# Patient Record
Sex: Female | Born: 1965 | Race: Asian | Hispanic: No | Marital: Married | Smoking: Current some day smoker
Health system: Southern US, Community
[De-identification: ages and names within clinical notes are randomized; demographics above are authoritative.]

## PROBLEM LIST (undated history)

## (undated) DIAGNOSIS — K219 Gastro-esophageal reflux disease without esophagitis: Secondary | ICD-10-CM

## (undated) DIAGNOSIS — T4145XA Adverse effect of unspecified anesthetic, initial encounter: Secondary | ICD-10-CM

## (undated) DIAGNOSIS — T8859XA Other complications of anesthesia, initial encounter: Secondary | ICD-10-CM

## (undated) DIAGNOSIS — D649 Anemia, unspecified: Secondary | ICD-10-CM

## (undated) DIAGNOSIS — R7303 Prediabetes: Secondary | ICD-10-CM

## (undated) DIAGNOSIS — M1990 Unspecified osteoarthritis, unspecified site: Secondary | ICD-10-CM

## (undated) DIAGNOSIS — I10 Essential (primary) hypertension: Secondary | ICD-10-CM

## (undated) HISTORY — DX: Essential (primary) hypertension: I10

## (undated) HISTORY — DX: Anemia, unspecified: D64.9

## (undated) HISTORY — PX: TUBAL LIGATION: SHX77

## (undated) HISTORY — PX: SHOULDER SURGERY: SHX246

---

## 2007-08-17 HISTORY — PX: ENDOMETRIAL BIOPSY: SHX622

## 2009-03-06 ENCOUNTER — Ambulatory Visit: Payer: Self-pay

## 2009-03-13 ENCOUNTER — Ambulatory Visit: Payer: Self-pay

## 2011-04-07 ENCOUNTER — Encounter: Payer: Self-pay | Admitting: Obstetrics and Gynecology

## 2011-04-12 ENCOUNTER — Ambulatory Visit (INDEPENDENT_AMBULATORY_CARE_PROVIDER_SITE_OTHER): Payer: PRIVATE HEALTH INSURANCE | Admitting: Obstetrics and Gynecology

## 2011-04-12 ENCOUNTER — Encounter: Payer: Self-pay | Admitting: Obstetrics and Gynecology

## 2011-04-12 VITALS — BP 148/104 | HR 94 | Ht 63.0 in | Wt 148.0 lb

## 2011-04-12 DIAGNOSIS — Z1272 Encounter for screening for malignant neoplasm of vagina: Secondary | ICD-10-CM

## 2011-04-12 DIAGNOSIS — Z01419 Encounter for gynecological examination (general) (routine) without abnormal findings: Secondary | ICD-10-CM

## 2011-04-12 DIAGNOSIS — I10 Essential (primary) hypertension: Secondary | ICD-10-CM | POA: Insufficient documentation

## 2011-04-12 DIAGNOSIS — Z1231 Encounter for screening mammogram for malignant neoplasm of breast: Secondary | ICD-10-CM

## 2011-04-12 NOTE — Progress Notes (Signed)
  Subjective:    Patient ID: Bosie Helper, female    DOB: 1966/06/06, 45 y.o.   MRN: [id number]  HPI 45 yo G3P3 presenting today for annual exam. Patient c/o abnormal vaginal odor, no discharge a week ago. This episode self-resolved but patient desires to be screened for yeast infection. Patient also with HTN but not currently being treated with antihypertensives. Patient denies pelvic pain or any other complaints. Patient is not concerned about STD at this time.   PMHx: HTN  PSHx: c-section x2, BTL, shoulder surgery  POBHx: 3 full term pregnancies  PGynHx: Denies cyst, fibroids or h/o abnormal pap smears  Family Hx: HTN, DM  Social Hx: Denies drinking or the use of illicit drugs, current smoker of 5 cigs/day  Review of Systems  All other systems reviewed and are negative.       Objective:   Physical Exam  GENERAL: Well-developed, well-nourished female in no acute distress.  HEENT: Normocephalic, atraumatic. Sclerae anicteric.  NECK: Supple. Normal thyroid.  LUNGS: Clear to auscultation bilaterally.  HEART: Regular rate and rhythm. BREASTS: Symmetric in size. No palpable masses or lymphadenopathy, skin changes, or nipple drainage. ABDOMEN: Soft, nontender, nondistended. No organomegaly. PELVIC: Normal external female genitalia. Vagina is pink and rugated.  Normal discharge. Normal appearing cervix. Uterus is normal in size.  No adnexal mass or tenderness. EXTREMITIES: No cyanosis, clubbing, or edema, 2+ distal pulses.       Assessment & Plan:  45 yo G3P3 here for annual exam. - Normal GYN exam - pap smear collected - referral for mammography provided - patient will be contacted with any abnormal results - patient advised to follow-up with primary care physician for management of HTN. - smoking cessation also advised. Patient states that she is trying to quit.

## 2011-04-12 NOTE — Patient Instructions (Addendum)
Medical Screening Exam You have had a medical screening exam to help find the cause of your problem, and to see if it requires further treatment right away. Your exam has shown that you do not require emergency treatment. It is safe for you to go to your caregiver's office or clinic for treatment. Plans may have been made for you to be seen by your caregiver today. Patients can be treated in an emergency room regardless of their ability to pay. You can decide to stay and receive emergency treatment. Some insurance plans may refuse to pay for this service if it is not approved by your primary caregiver. The hospital or your caregiver may then bill you directly for your medical evaluation and treatment. If your condition worsens or changes in any way, you can return for re-evaluation. You may also go to your caregiver's office or clinic for treatment. Document Released: 09/09/2004 Document Re-Released: 05/11/2008 [facility] Patient Information 2011 [place], [place].  Hypertension (High Blood Pressure) As your heart beats, it forces blood through your arteries. This force is your blood pressure. If the pressure is too high, it is called hypertension (HTN) or high blood pressure. HTN is dangerous because you may have it and not know it. High blood pressure may mean that your heart has to work harder to pump blood. Your arteries may be narrow or stiff. The extra work puts you at risk for heart disease, stroke, and other problems.  Blood pressure consists of two numbers, a higher number over a lower, 110/72, for example. It is stated as "110 over 72." The ideal is below 120 for the top number (systolic) and under 80 for the bottom (diastolic). Write down your blood pressure today. You should pay close attention to your blood pressure if you have certain conditions such as:  Heart failure.  Prior heart attack.   Diabetes   Chronic kidney disease.   Prior stroke.   Multiple risk factors for heart disease.    To see if you have HTN, your blood pressure should be measured while you are seated with your arm held at the level of the heart. It should be measured at least twice. A one-time elevated blood pressure reading (especially in the Emergency Department) does not mean that you need treatment. There may be conditions in which the blood pressure is different between your right and left arms. It is important to see your caregiver soon for a recheck. Most people have essential hypertension which means that there is not a specific cause. This type of high blood pressure may be lowered by changing lifestyle factors such as:  Stress.  Smoking.   Lack of exercise.   Excessive weight.  Drug/tobacco/alcohol use.   Eating less salt.   Most people do not have symptoms from high blood pressure until it has caused damage to the body. Effective treatment can often prevent, delay or reduce that damage. TREATMENT Treatment for high blood pressure, when a cause has been identified, is directed at the cause. There are a large number of medications to treat HTN. These fall into several categories, and your caregiver will help you select the medicines that are best for you. Medications may have side effects. You should review side effects with your caregiver. If your blood pressure stays high after you have made lifestyle changes or started on medicines,   Your medication(s) may need to be changed.   Other problems may need to be addressed.   Be certain you understand your prescriptions,  and know how and when to take your medicine.   Be sure to follow up with your caregiver within the time frame advised (usually within two weeks) to have your blood pressure rechecked and to review your medications.   If you are taking more than one medicine to lower your blood pressure, make sure you know how and at what times they should be taken. Taking two medicines at the same time can result in blood pressure that is too  low.  SEEK IMMEDIATE MEDICAL CARE IF YOU DEVELOP:  A severe headache, blurred or changing vision, or confusion.   Unusual weakness or numbness, or a faint feeling.   Severe chest or abdominal pain, vomiting, or breathing problems.  MAKE SURE YOU:   Understand these instructions.   Will watch your condition.   Will get help right away if you are not doing well or get worse.  Document Released: 08/02/2005 Document Re-Released: 01/20/2010 [facility] Patient Information 2011 [place], [place].

## 2011-04-13 ENCOUNTER — Other Ambulatory Visit: Payer: Self-pay | Admitting: Obstetrics and Gynecology

## 2011-04-14 LAB — WET PREP, GENITAL: Trich, Wet Prep: NONE SEEN

## 2011-05-03 ENCOUNTER — Ambulatory Visit (HOSPITAL_COMMUNITY)
Admission: RE | Admit: 2011-05-03 | Discharge: 2011-05-03 | Disposition: A | Discharge status: Home / Self Care | Payer: PRIVATE HEALTH INSURANCE | Source: Ambulatory Visit | Attending: Obstetrics and Gynecology | Admitting: Obstetrics and Gynecology

## 2011-05-03 DIAGNOSIS — Z1231 Encounter for screening mammogram for malignant neoplasm of breast: Secondary | ICD-10-CM | POA: Insufficient documentation

## 2011-10-27 ENCOUNTER — Emergency Department: Payer: Self-pay | Admitting: Internal Medicine

## 2011-10-27 LAB — COMPREHENSIVE METABOLIC PANEL
BUN: 15 mg/dL (ref 7–18)
EGFR (African American): >60
Glucose: 85 mg/dL (ref 65–99)
Potassium: 3.9 mmol/L (ref 3.5–5.1)
SGOT(AST): 20 U/L (ref 15–37)
SGPT (ALT): 21 U/L
Total Protein: 8 g/dL (ref 6.4–8.2)

## 2011-10-27 LAB — CK TOTAL AND CKMB (NOT AT ARMC): CK-MB: <0.5 ng/mL — ABNORMAL LOW (ref 0.5–3.6)

## 2011-10-27 LAB — CBC
HGB: 11.3 g/dL — ABNORMAL LOW (ref 12.0–16.0)
MCHC: 31.7 g/dL — ABNORMAL LOW (ref 32.0–36.0)
Platelet: 233 10*3/uL (ref 150–440)
RBC: 4.63 10*6/uL (ref 3.80–5.20)
RDW: 16.8 % — ABNORMAL HIGH (ref 11.5–14.5)

## 2011-11-23 ENCOUNTER — Ambulatory Visit: Payer: Self-pay | Admitting: Internal Medicine

## 2012-04-11 ENCOUNTER — Ambulatory Visit (INDEPENDENT_AMBULATORY_CARE_PROVIDER_SITE_OTHER): Payer: PRIVATE HEALTH INSURANCE | Admitting: Family Medicine

## 2012-04-11 ENCOUNTER — Encounter: Payer: Self-pay | Admitting: Family Medicine

## 2012-04-11 VITALS — BP 150/100 | HR 80 | Ht 63.0 in | Wt 145.0 lb

## 2012-04-11 DIAGNOSIS — Z124 Encounter for screening for malignant neoplasm of cervix: Secondary | ICD-10-CM

## 2012-04-11 DIAGNOSIS — K219 Gastro-esophageal reflux disease without esophagitis: Secondary | ICD-10-CM | POA: Insufficient documentation

## 2012-04-11 DIAGNOSIS — Z1231 Encounter for screening mammogram for malignant neoplasm of breast: Secondary | ICD-10-CM

## 2012-04-11 DIAGNOSIS — Z01419 Encounter for gynecological examination (general) (routine) without abnormal findings: Secondary | ICD-10-CM

## 2012-04-11 DIAGNOSIS — Z1151 Encounter for screening for human papillomavirus (HPV): Secondary | ICD-10-CM

## 2012-04-11 NOTE — Patient Instructions (Addendum)
Preventive Care for Adults, Female A healthy lifestyle and preventive care can promote health and wellness. Preventive health guidelines for women include the following key practices.  A routine yearly physical is a good way to check with your caregiver about your health and preventive screening. It is a chance to share any concerns and updates on your health, and to receive a thorough exam.   Visit your dentist for a routine exam and preventive care every 6 months. Brush your teeth twice a day and floss once a day. Good oral hygiene prevents tooth decay and gum disease.   The frequency of eye exams is based on your age, health, family medical history, use of contact lenses, and other factors. Follow your caregiver's recommendations for frequency of eye exams.   Eat a healthy diet. Foods like vegetables, fruits, whole grains, low-fat dairy products, and lean protein foods contain the nutrients you need without too many calories. Decrease your intake of foods high in solid fats, added sugars, and salt. Eat the right amount of calories for you.Get information about a proper diet from your caregiver, if necessary.   Regular physical exercise is one of the most important things you can do for your health. Most adults should get at least 150 minutes of moderate-intensity exercise (any activity that increases your heart rate and causes you to sweat) each week. In addition, most adults need muscle-strengthening exercises on 2 or more days a week.   Maintain a healthy weight. The body mass index (BMI) is a screening tool to identify possible weight problems. It provides an estimate of body fat based on height and weight. Your caregiver can help determine your BMI, and can help you achieve or maintain a healthy weight.For adults 20 years and older:   A BMI below 18.5 is considered underweight.   A BMI of 18.5 to 24.9 is normal.   A BMI of 25 to 29.9 is considered overweight.   A BMI of 30 and above is  considered obese.   Maintain normal blood lipids and cholesterol levels by exercising and minimizing your intake of saturated fat. Eat a balanced diet with plenty of fruit and vegetables. Blood tests for lipids and cholesterol should begin at age 20 and be repeated every 5 years. If your lipid or cholesterol levels are high, you are over 50, or you are at high risk for heart disease, you may need your cholesterol levels checked more frequently.Ongoing high lipid and cholesterol levels should be treated with medicines if diet and exercise are not effective.   If you smoke, find out from your caregiver how to quit. If you do not use tobacco, do not start.   If you are pregnant, do not drink alcohol. If you are breastfeeding, be very cautious about drinking alcohol. If you are not pregnant and choose to drink alcohol, do not exceed 1 drink per day. One drink is considered to be 12 ounces (355 mL) of beer, 5 ounces (148 mL) of wine, or 1.5 ounces (44 mL) of liquor.   Avoid use of street drugs. Do not share needles with anyone. Ask for help if you need support or instructions about stopping the use of drugs.   High blood pressure causes heart disease and increases the risk of stroke. Your blood pressure should be checked at least every 1 to 2 years. Ongoing high blood pressure should be treated with medicines if weight loss and exercise are not effective.   If you are 55 to 45   years old, ask your caregiver if you should take aspirin to prevent strokes.   Diabetes screening involves taking a blood sample to check your fasting blood sugar level. This should be done once every 3 years, after age 45, if you are within normal weight and without risk factors for diabetes. Testing should be considered at a younger age or be carried out more frequently if you are overweight and have at least 1 risk factor for diabetes.   Breast cancer screening is essential preventive care for women. You should practice "breast  self-awareness." This means understanding the normal appearance and feel of your breasts and may include breast self-examination. Any changes detected, no matter how small, should be reported to a caregiver. Women in their 20s and 30s should have a clinical breast exam (CBE) by a caregiver as part of a regular health exam every 1 to 3 years. After age 40, women should have a CBE every year. Starting at age 40, women should consider having a mammography (breast X-ray test) every year. Women who have a family history of breast cancer should talk to their caregiver about genetic screening. Women at a high risk of breast cancer should talk to their caregivers about having magnetic resonance imaging (MRI) and a mammography every year.   The Pap test is a screening test for cervical cancer. A Pap test can show cell changes on the cervix that might become cervical cancer if left untreated. A Pap test is a procedure in which cells are obtained and examined from the lower end of the uterus (cervix).   Women should have a Pap test starting at age 21.   Between ages 21 and 29, Pap tests should be repeated every 2 years.   Beginning at age 30, you should have a Pap test every 3 years as long as the past 3 Pap tests have been normal.   Some women have medical problems that increase the chance of getting cervical cancer. Talk to your caregiver about these problems. It is especially important to talk to your caregiver if a new problem develops soon after your last Pap test. In these cases, your caregiver may recommend more frequent screening and Pap tests.   The above recommendations are the same for women who have or have not gotten the vaccine for human papillomavirus (HPV).   If you had a hysterectomy for a problem that was not cancer or a condition that could lead to cancer, then you no longer need Pap tests. Even if you no longer need a Pap test, a regular exam is a good idea to make sure no other problems are  starting.   If you are between ages 65 and 70, and you have had normal Pap tests going back 10 years, you no longer need Pap tests. Even if you no longer need a Pap test, a regular exam is a good idea to make sure no other problems are starting.   If you have had past treatment for cervical cancer or a condition that could lead to cancer, you need Pap tests and screening for cancer for at least 20 years after your treatment.   If Pap tests have been discontinued, risk factors (such as a new sexual partner) need to be reassessed to determine if screening should be resumed.   The HPV test is an additional test that may be used for cervical cancer screening. The HPV test looks for the virus that can cause the cell changes on the cervix.   The cells collected during the Pap test can be tested for HPV. The HPV test could be used to screen women aged 30 years and older, and should be used in women of any age who have unclear Pap test results. After the age of 30, women should have HPV testing at the same frequency as a Pap test.   Colorectal cancer can be detected and often prevented. Most routine colorectal cancer screening begins at the age of 50 and continues through age 75. However, your caregiver may recommend screening at an earlier age if you have risk factors for colon cancer. On a yearly basis, your caregiver may provide home test kits to check for hidden blood in the stool. Use of a small camera at the end of a tube, to directly examine the colon (sigmoidoscopy or colonoscopy), can detect the earliest forms of colorectal cancer. Talk to your caregiver about this at age 50, when routine screening begins. Direct examination of the colon should be repeated every 5 to 10 years through age 75, unless early forms of pre-cancerous polyps or small growths are found.   Hepatitis C blood testing is recommended for all people born from 1945 through 1965 and any individual with known risks for hepatitis C.    Practice safe sex. Use condoms and avoid high-risk sexual practices to reduce the spread of sexually transmitted infections (STIs). STIs include gonorrhea, chlamydia, syphilis, trichomonas, herpes, HPV, and human immunodeficiency virus (HIV). Herpes, HIV, and HPV are viral illnesses that have no cure. They can result in disability, cancer, and death. Sexually active women aged 25 and younger should be checked for chlamydia. Older women with new or multiple partners should also be tested for chlamydia. Testing for other STIs is recommended if you are sexually active and at increased risk.   Osteoporosis is a disease in which the bones lose minerals and strength with aging. This can result in serious bone fractures. The risk of osteoporosis can be identified using a bone density scan. Women ages 65 and over and women at risk for fractures or osteoporosis should discuss screening with their caregivers. Ask your caregiver whether you should take a calcium supplement or vitamin D to reduce the rate of osteoporosis.   Menopause can be associated with physical symptoms and risks. Hormone replacement therapy is available to decrease symptoms and risks. You should talk to your caregiver about whether hormone replacement therapy is right for you.   Use sunscreen with sun protection factor (SPF) of 30 or more. Apply sunscreen liberally and repeatedly throughout the day. You should seek shade when your shadow is shorter than you. Protect yourself by wearing long sleeves, pants, a wide-brimmed hat, and sunglasses year round, whenever you are outdoors.   Once a month, do a whole body skin exam, using a mirror to look at the skin on your back. Notify your caregiver of new moles, moles that have irregular borders, moles that are larger than a pencil eraser, or moles that have changed in shape or color.   Stay current with required immunizations.   Influenza. You need a dose every fall (or winter). The composition of  the flu vaccine changes each year, so being vaccinated once is not enough.   Pneumococcal polysaccharide. You need 1 to 2 doses if you smoke cigarettes or if you have certain chronic medical conditions. You need 1 dose at age 65 (or older) if you have never been vaccinated.   Tetanus, diphtheria, pertussis (Tdap, Td). Get 1 dose of   Tdap vaccine if you are younger than age 65, are over 65 and have contact with an infant, are a healthcare worker, are pregnant, or simply want to be protected from whooping cough. After that, you need a Td booster dose every 10 years. Consult your caregiver if you have not had at least 3 tetanus and diphtheria-containing shots sometime in your life or have a deep or dirty wound.   HPV. You need this vaccine if you are a woman age 26 or younger. The vaccine is given in 3 doses over 6 months.   Measles, mumps, rubella (MMR). You need at least 1 dose of MMR if you were born in 1957 or later. You may also need a second dose.   Meningococcal. If you are age 19 to 21 and a first-year college student living in a residence hall, or have one of several medical conditions, you need to get vaccinated against meningococcal disease. You may also need additional booster doses.   Zoster (shingles). If you are age 60 or older, you should get this vaccine.   Varicella (chickenpox). If you have never had chickenpox or you were vaccinated but received only 1 dose, talk to your caregiver to find out if you need this vaccine.   Hepatitis A. You need this vaccine if you have a specific risk factor for hepatitis A virus infection or you simply wish to be protected from this disease. The vaccine is usually given as 2 doses, 6 to 18 months apart.   Hepatitis B. You need this vaccine if you have a specific risk factor for hepatitis B virus infection or you simply wish to be protected from this disease. The vaccine is given in 3 doses, usually over 6 months.  Preventive Services /  Frequency Ages 19 to 39  Blood pressure check.** / Every 1 to 2 years.   Lipid and cholesterol check.** / Every 5 years beginning at age 20.   Clinical breast exam.** / Every 3 years for women in their 20s and 30s.   Pap test.** / Every 2 years from ages 21 through 29. Every 3 years starting at age 30 through age 65 or 70 with a history of 3 consecutive normal Pap tests.   HPV screening.** / Every 3 years from ages 30 through ages 65 to 70 with a history of 3 consecutive normal Pap tests.   Hepatitis C blood test.** / For any individual with known risks for hepatitis C.   Skin self-exam. / Monthly.   Influenza immunization.** / Every year.   Pneumococcal polysaccharide immunization.** / 1 to 2 doses if you smoke cigarettes or if you have certain chronic medical conditions.   Tetanus, diphtheria, pertussis (Tdap, Td) immunization. / A one-time dose of Tdap vaccine. After that, you need a Td booster dose every 10 years.   HPV immunization. / 3 doses over 6 months, if you are 26 and younger.   Measles, mumps, rubella (MMR) immunization. / You need at least 1 dose of MMR if you were born in 1957 or later. You may also need a second dose.   Meningococcal immunization. / 1 dose if you are age 19 to 21 and a first-year college student living in a residence hall, or have one of several medical conditions, you need to get vaccinated against meningococcal disease. You may also need additional booster doses.   Varicella immunization.** / Consult your caregiver.   Hepatitis A immunization.** / Consult your caregiver. 2 doses, 6 to 18 months   apart.   Hepatitis B immunization.** / Consult your caregiver. 3 doses usually over 6 months.  Ages 40 to 64  Blood pressure check.** / Every 1 to 2 years.   Lipid and cholesterol check.** / Every 5 years beginning at age 20.   Clinical breast exam.** / Every year after age 40.   Mammogram.** / Every year beginning at age 40 and continuing for as  long as you are in good health. Consult with your caregiver.   Pap test.** / Every 3 years starting at age 30 through age 65 or 70 with a history of 3 consecutive normal Pap tests.   HPV screening.** / Every 3 years from ages 30 through ages 65 to 70 with a history of 3 consecutive normal Pap tests.   Fecal occult blood test (FOBT) of stool. / Every year beginning at age 50 and continuing until age 75. You may not need to do this test if you get a colonoscopy every 10 years.   Flexible sigmoidoscopy or colonoscopy.** / Every 5 years for a flexible sigmoidoscopy or every 10 years for a colonoscopy beginning at age 50 and continuing until age 75.   Hepatitis C blood test.** / For all people born from 1945 through 1965 and any individual with known risks for hepatitis C.   Skin self-exam. / Monthly.   Influenza immunization.** / Every year.   Pneumococcal polysaccharide immunization.** / 1 to 2 doses if you smoke cigarettes or if you have certain chronic medical conditions.   Tetanus, diphtheria, pertussis (Tdap, Td) immunization.** / A one-time dose of Tdap vaccine. After that, you need a Td booster dose every 10 years.   Measles, mumps, rubella (MMR) immunization. / You need at least 1 dose of MMR if you were born in 1957 or later. You may also need a second dose.   Varicella immunization.** / Consult your caregiver.   Meningococcal immunization.** / Consult your caregiver.   Hepatitis A immunization.** / Consult your caregiver. 2 doses, 6 to 18 months apart.   Hepatitis B immunization.** / Consult your caregiver. 3 doses, usually over 6 months.  Ages 65 and over  Blood pressure check.** / Every 1 to 2 years.   Lipid and cholesterol check.** / Every 5 years beginning at age 20.   Clinical breast exam.** / Every year after age 40.   Mammogram.** / Every year beginning at age 40 and continuing for as long as you are in good health. Consult with your caregiver.   Pap test.** /  Every 3 years starting at age 30 through age 65 or 70 with a 3 consecutive normal Pap tests. Testing can be stopped between 65 and 70 with 3 consecutive normal Pap tests and no abnormal Pap or HPV tests in the past 10 years.   HPV screening.** / Every 3 years from ages 30 through ages 65 or 70 with a history of 3 consecutive normal Pap tests. Testing can be stopped between 65 and 70 with 3 consecutive normal Pap tests and no abnormal Pap or HPV tests in the past 10 years.   Fecal occult blood test (FOBT) of stool. / Every year beginning at age 50 and continuing until age 75. You may not need to do this test if you get a colonoscopy every 10 years.   Flexible sigmoidoscopy or colonoscopy.** / Every 5 years for a flexible sigmoidoscopy or every 10 years for a colonoscopy beginning at age 50 and continuing until age 75.   Hepatitis   C blood test.** / For all people born from 1945 through 1965 and any individual with known risks for hepatitis C.   Osteoporosis screening.** / A one-time screening for women ages 65 and over and women at risk for fractures or osteoporosis.   Skin self-exam. / Monthly.   Influenza immunization.** / Every year.   Pneumococcal polysaccharide immunization.** / 1 dose at age 65 (or older) if you have never been vaccinated.   Tetanus, diphtheria, pertussis (Tdap, Td) immunization. / A one-time dose of Tdap vaccine if you are over 65 and have contact with an infant, are a healthcare worker, or simply want to be protected from whooping cough. After that, you need a Td booster dose every 10 years.   Varicella immunization.** / Consult your caregiver.   Meningococcal immunization.** / Consult your caregiver.   Hepatitis A immunization.** / Consult your caregiver. 2 doses, 6 to 18 months apart.   Hepatitis B immunization.** / Check with your caregiver. 3 doses, usually over 6 months.  ** Family history and personal history of risk and conditions may change your caregiver's  recommendations. Document Released: 09/28/2001 Document Revised: 07/22/2011 Document Reviewed: 12/28/2010 [employer] Patient Information 2012 [employer].Smoking Cessation This document explains the best ways for you to quit smoking and new treatments to help. It lists new medicines that can double or triple your chances of quitting and quitting for good. It also considers ways to avoid relapses and concerns you may have about quitting, including weight gain. NICOTINE: A POWERFUL ADDICTION If you have tried to quit smoking, you know how hard it can be. It is hard because nicotine is a very addictive drug. For some people, it can be as addictive as heroin or cocaine. Usually, people make 2 or 3 tries, or more, before finally being able to quit. Each time you try to quit, you can learn about what helps and what hurts. Quitting takes hard work and a lot of effort, but you can quit smoking. QUITTING SMOKING IS ONE OF THE MOST IMPORTANT THINGS YOU WILL EVER DO.  You will live longer, feel better, and live better.   The impact on your body of quitting smoking is felt almost immediately:   Within 20 minutes, blood pressure decreases. Pulse returns to its normal level.   After 8 hours, carbon monoxide levels in the blood return to normal. Oxygen level increases.   After 24 hours, chance of heart attack starts to decrease. Breath, hair, and body stop smelling like smoke.   After 48 hours, damaged nerve endings begin to recover. Sense of taste and smell improve.   After 72 hours, the body is virtually free of nicotine. Bronchial tubes relax and breathing becomes easier.   After 2 to 12 weeks, lungs can hold more air. Exercise becomes easier and circulation improves.   Quitting will reduce your risk of having a heart attack, stroke, cancer, or lung disease:   After 1 year, the risk of coronary heart disease is cut in half.   After 5 years, the risk of stroke falls to the same as a nonsmoker.    After 10 years, the risk of lung cancer is cut in half and the risk of other cancers decreases significantly.   After 15 years, the risk of coronary heart disease drops, usually to the level of a nonsmoker.   If you are pregnant, quitting smoking will improve your chances of having a healthy baby.   The people you live with, especially your children, will   be healthier.   You will have extra money to spend on things other than cigarettes.  FIVE KEYS TO QUITTING Studies have shown that these 5 steps will help you quit smoking and quit for good. You have the best chances of quitting if you use them together: 1. Get ready.  2. Get support and encouragement.  3. Learn new skills and behaviors.  4. Get medicine to reduce your nicotine addiction and use it correctly.  5. Be prepared for relapse or difficult situations. Be determined to continue trying to quit, even if you do not succeed at first.  1. GET READY  Set a quit date.   Change your environment.   Get rid of ALL cigarettes, ashtrays, matches, and lighters in your home, car, and place of work.   Do not let people smoke in your home.   Review your past attempts to quit. Think about what worked and what did not.   Once you quit, do not smoke. NOT EVEN A PUFF!  2. GET SUPPORT AND ENCOURAGEMENT Studies have shown that you have a better chance of being successful if you have help. You can get support in many ways.  Tell your family, friends, and coworkers that you are going to quit and need their support. Ask them not to smoke around you.   Talk to your caregivers (doctor, dentist, nurse, pharmacist, psychologist, and/or smoking counselor).   Get individual, group, or telephone counseling and support. The more counseling you have, the better your chances are of quitting. Programs are available at local hospitals and health centers. Call your local health department for information about programs in your area.   Spiritual beliefs  and practices may help some smokers quit.   Quit meters are small computer programs online or downloadable that keep track of quit statistics, such as amount of "quit-time," cigarettes not smoked, and money saved.   Many smokers find one or more of the many self-help books available useful in helping them quit and stay off tobacco.  3. LEARN NEW SKILLS AND BEHAVIORS  Try to distract yourself from urges to smoke. Talk to someone, go for a walk, or occupy your time with a task.   When you first try to quit, change your routine. Take a different route to work. Drink tea instead of coffee. Eat breakfast in a different place.   Do something to reduce your stress. Take a hot bath, exercise, or read a book.   Plan something enjoyable to do every day. Reward yourself for not smoking.   Explore interactive web-based programs that specialize in helping you quit.  4. GET MEDICINE AND USE IT CORRECTLY Medicines can help you stop smoking and decrease the urge to smoke. Combining medicine with the above behavioral methods and support can quadruple your chances of successfully quitting smoking. The [place] Food and Drug Administration (FDA) has approved 7 medicines to help you quit smoking. These medicines fall into 3 categories.  Nicotine replacement therapy (delivers nicotine to your body without the negative effects and risks of smoking):   Nicotine gum: Available over-the-counter.   Nicotine lozenges: Available over-the-counter.   Nicotine inhaler: Available by prescription.   Nicotine nasal spray: Available by prescription.   Nicotine skin patches (transdermal): Available by prescription and over-the-counter.   Antidepressant medicine (helps people abstain from smoking, but how this works is unknown):   Bupropion sustained-release (SR) tablets: Available by prescription.   Nicotinic receptor partial agonist (simulates the effect of nicotine in your brain):     Varenicline tartrate tablets:  Available by prescription.   Ask your caregiver for advice about which medicines to use and how to use them. Carefully read the information on the package.   Everyone who is trying to quit may benefit from using a medicine. If you are pregnant or trying to become pregnant, nursing an infant, you are under age 18, or you smoke fewer than 10 cigarettes per day, talk to your caregiver before taking any nicotine replacement medicines.   You should stop using a nicotine replacement product and call your caregiver if you experience nausea, dizziness, weakness, vomiting, fast or irregular heartbeat, mouth problems with the lozenge or gum, or redness or swelling of the skin around the patch that does not go away.   Do not use any other product containing nicotine while using a nicotine replacement product.   Talk to your caregiver before using these products if you have diabetes, heart disease, asthma, stomach ulcers, you had a recent heart attack, you have high blood pressure that is not controlled with medicine, a history of irregular heartbeat, or you have been prescribed medicine to help you quit smoking.  5. BE PREPARED FOR RELAPSE OR DIFFICULT SITUATIONS  Most relapses occur within the first 3 months after quitting. Do not be discouraged if you start smoking again. Remember, most people try several times before they finally quit.   You may have symptoms of withdrawal because your body is used to nicotine. You may crave cigarettes, be irritable, feel very hungry, cough often, get headaches, or have difficulty concentrating.   The withdrawal symptoms are only temporary. They are strongest when you first quit, but they will go away within 10 to 14 days.  Here are some difficult situations to watch for:  Alcohol. Avoid drinking alcohol. Drinking lowers your chances of successfully quitting.   Caffeine. Try to reduce the amount of caffeine you consume. It also lowers your chances of successfully  quitting.   Other smokers. Being around smoking can make you want to smoke. Avoid smokers.   Weight gain. Many smokers will gain weight when they quit, usually less than 10 pounds. Eat a healthy diet and stay active. Do not let weight gain distract you from your main goal, quitting smoking. Some medicines that help you quit smoking may also help delay weight gain. You can always lose the weight gained after you quit.   Bad mood or depression. There are a lot of ways to improve your mood other than smoking.  If you are having problems with any of these situations, talk to your caregiver. SPECIAL SITUATIONS AND CONDITIONS Studies suggest that everyone can quit smoking. Your situation or condition can give you a special reason to quit.  Pregnant women/new mothers: By quitting, you protect your baby's health and your own.   Hospitalized patients: By quitting, you reduce health problems and help healing.   Heart attack patients: By quitting, you reduce your risk of a second heart attack.   Lung, head, and neck cancer patients: By quitting, you reduce your chance of a second cancer.   Parents of children and adolescents: By quitting, you protect your children from illnesses caused by secondhand smoke.  QUESTIONS TO THINK ABOUT Think about the following questions before you try to stop smoking. You may want to talk about your answers with your caregiver.  Why do you want to quit?   If you tried to quit in the past, what helped and what did not?   What will   be the most difficult situations for you after you quit? How will you plan to handle them?   Who can help you through the tough times? Your family? Friends? Caregiver?   What pleasures do you get from smoking? What ways can you still get pleasure if you quit?  Here are some questions to ask your caregiver:  How can you help me to be successful at quitting?   What medicine do you think would be best for me and how should I take it?    What should I do if I need more help?   What is smoking withdrawal like? How can I get information on withdrawal?  Quitting takes hard work and a lot of effort, but you can quit smoking. FOR MORE INFORMATION  Smokefree.gov (http://www.smokefree.gov) provides free, accurate, evidence-based information and professional assistance to help support the immediate and long-term needs of people trying to quit smoking. Document Released: 07/27/2001 Document Revised: 07/22/2011 Document Reviewed: 05/19/2009 [employer] Patient Information 2012 [employer]. 

## 2012-04-11 NOTE — Progress Notes (Signed)
  Subjective:     Megan Pollard is a 46 y.o. female and is here for a comprehensive physical exam. The patient reports she takes her BP medication (unsure of name) only when BP is high.  Dr. Thedore Mins follows her and checks her labs.  Has regular cycles.  Does smoke < 0.25 ppd.  Desires to quit.  S/p BTL  History   Social History  . Marital Status: Married    Spouse Name: N/A    Number of Children: N/A  . Years of Education: N/A   Occupational History  . Not on file.   Social History Main Topics  . Smoking status: Current Everyday Smoker -- 0.2 packs/day    Types: Cigarettes  . Smokeless tobacco: Current User  . Alcohol Use: Yes     once or twice yearly  . Drug Use: No  . Sexually Active: Yes -- Female partner(s)     tubalization.   Other Topics Concern  . Not on file   Social History Narrative  . No narrative on file   Health Maintenance  Topic Date Due  . Tetanus/tdap  01/27/1986  . Influenza Vaccine  05/16/2012  . Pap Smear  04/11/2014    The following portions of the patient's history were reviewed and updated as appropriate: allergies, current medications, past family history, past medical history, past social history, past surgical history and problem list.  Review of Systems A comprehensive review of systems was negative.   Objective:    BP 150/100  Pulse 80  Ht 5\' 3"  (1.6 m)  Wt 145 lb (65.772 kg)  BMI 25.69 kg/m2  LMP 03/20/2012 General appearance: alert, cooperative and appears stated age Head: Normocephalic, without obvious abnormality, atraumatic Neck: no adenopathy, supple, symmetrical, trachea midline and thyroid not enlarged, symmetric, no tenderness/mass/nodules Lungs: clear to auscultation bilaterally Breasts: normal appearance, no masses or tenderness Heart: regular rate and rhythm, S1, S2 normal, no murmur, click, rub or gallop Abdomen: soft, non-tender; bowel sounds normal; no masses,  no organomegaly Pelvic: cervix normal in appearance, external  genitalia normal, no adnexal masses or tenderness, no cervical motion tenderness, uterus normal size, shape, and consistency and vagina normal without discharge Extremities: extremities normal, atraumatic, no cyanosis or edema Pulses: 2+ and symmetric Skin: Skin color, texture, turgor normal. No rashes or lesions or varicose veins on legs. Lymph nodes: Cervical, supraclavicular, and axillary nodes normal. Neurologic: Grossly normal    Assessment:    Healthy female exam. Elevated BP. Tobacco use.     Plan:    Pap smear Mammogram Reviewed smoking cessation Reviewed risks of elevated BP. See After Visit Summary for Counseling Recommendations

## 2012-04-25 ENCOUNTER — Ambulatory Visit (HOSPITAL_COMMUNITY)
Admission: RE | Admit: 2012-04-25 | Discharge: 2012-04-25 | Disposition: A | Discharge status: Home / Self Care | Payer: PRIVATE HEALTH INSURANCE | Source: Ambulatory Visit | Attending: Family Medicine | Admitting: Family Medicine

## 2012-04-25 DIAGNOSIS — Z1231 Encounter for screening mammogram for malignant neoplasm of breast: Secondary | ICD-10-CM | POA: Insufficient documentation

## 2012-09-21 ENCOUNTER — Telehealth: Payer: Self-pay | Admitting: *Deleted

## 2012-09-21 DIAGNOSIS — N390 Urinary tract infection, site not specified: Secondary | ICD-10-CM

## 2012-09-21 MED ORDER — PHENAZOPYRIDINE HCL 200 MG PO TABS: 200.0000 mg | ORAL_TABLET | Freq: Three times a day (TID) | ORAL | Status: DC | PRN

## 2012-09-21 MED ORDER — CIPROFLOXACIN HCL 500 MG PO TABS: 500.0000 mg | ORAL_TABLET | Freq: Two times a day (BID) | ORAL | Status: DC

## 2012-09-21 NOTE — Telephone Encounter (Signed)
Patient is having burning and pain with urination, this has been going on for 4-5 days now.  She is at work now and unable to leave and she would like to know if we can call in medication for her to help her out so she don't miss work.  She will call if her symptoms persist for an appointment.

## 2012-10-27 ENCOUNTER — Other Ambulatory Visit (INDEPENDENT_AMBULATORY_CARE_PROVIDER_SITE_OTHER): Payer: PRIVATE HEALTH INSURANCE

## 2012-10-27 DIAGNOSIS — R350 Frequency of micturition: Secondary | ICD-10-CM

## 2012-10-27 DIAGNOSIS — R319 Hematuria, unspecified: Secondary | ICD-10-CM

## 2012-10-27 DIAGNOSIS — R300 Dysuria: Secondary | ICD-10-CM

## 2012-10-27 LAB — URINALYSIS
Glucose, UA: NEGATIVE
Spec Grav, UA: >=1.03
Urobilinogen, UA: 0.2

## 2012-10-27 MED ORDER — SULFAMETHOXAZOLE-TRIMETHOPRIM 800-160 MG PO TABS: 1.0000 | ORAL_TABLET | Freq: Two times a day (BID) | ORAL | Status: DC

## 2012-10-27 NOTE — Progress Notes (Signed)
Loreena called and spoke with Darl Pikes, reports she has a UTI again and wants to be treated- reports dysuria, back pain same as last time- instructed patient to come in and give urine and we can start Septra- then when culture comes back - if another antibiotic would be better, we will call her. Patient came in and gave urine.

## 2012-10-30 LAB — URINE CULTURE: Colony Count: >=100000

## 2012-11-01 ENCOUNTER — Telehealth: Payer: Self-pay | Admitting: *Deleted

## 2012-11-01 DIAGNOSIS — N390 Urinary tract infection, site not specified: Secondary | ICD-10-CM

## 2012-11-01 MED ORDER — SULFAMETHOXAZOLE-TRIMETHOPRIM 800-160 MG PO TABS: 1.0000 | ORAL_TABLET | Freq: Two times a day (BID) | ORAL | Status: DC

## 2012-11-01 NOTE — Telephone Encounter (Signed)
Message left for patient to pick up rx from the pharmacy.

## 2013-05-29 ENCOUNTER — Encounter: Payer: Self-pay | Admitting: Obstetrics & Gynecology

## 2013-05-29 ENCOUNTER — Ambulatory Visit (INDEPENDENT_AMBULATORY_CARE_PROVIDER_SITE_OTHER): Payer: PRIVATE HEALTH INSURANCE | Admitting: Obstetrics & Gynecology

## 2013-05-29 VITALS — BP 154/97 | HR 86 | Ht 63.0 in | Wt 155.0 lb

## 2013-05-29 DIAGNOSIS — Z01419 Encounter for gynecological examination (general) (routine) without abnormal findings: Secondary | ICD-10-CM

## 2013-05-29 DIAGNOSIS — Z1151 Encounter for screening for human papillomavirus (HPV): Secondary | ICD-10-CM

## 2013-05-29 DIAGNOSIS — N390 Urinary tract infection, site not specified: Secondary | ICD-10-CM

## 2013-05-29 DIAGNOSIS — R8290 Unspecified abnormal findings in urine: Secondary | ICD-10-CM

## 2013-05-29 DIAGNOSIS — R82998 Other abnormal findings in urine: Secondary | ICD-10-CM

## 2013-05-29 DIAGNOSIS — Z124 Encounter for screening for malignant neoplasm of cervix: Secondary | ICD-10-CM

## 2013-05-29 LAB — POCT URINALYSIS DIPSTICK
Bilirubin, UA: NEGATIVE
Blood, UA: NEGATIVE
Nitrite, UA: NEGATIVE
pH, UA: 7

## 2013-05-29 NOTE — Patient Instructions (Signed)
Preventive Care for Adults, Female A healthy lifestyle and preventive care can promote health and wellness. Preventive health guidelines for women include the following key practices.  A routine yearly physical is a good way to check with your caregiver about your health and preventive screening. It is a chance to share any concerns and updates on your health, and to receive a thorough exam.  Visit your dentist for a routine exam and preventive care every 6 months. Brush your teeth twice a day and floss once a day. Good oral hygiene prevents tooth decay and gum disease.  The frequency of eye exams is based on your age, health, family medical history, use of contact lenses, and other factors. Follow your caregiver's recommendations for frequency of eye exams.  Eat a healthy diet. Foods like vegetables, fruits, whole grains, low-fat dairy products, and lean protein foods contain the nutrients you need without too many calories. Decrease your intake of foods high in solid fats, added sugars, and salt. Eat the right amount of calories for you.Get information about a proper diet from your caregiver, if necessary.  Regular physical exercise is one of the most important things you can do for your health. Most adults should get at least 150 minutes of moderate-intensity exercise (any activity that increases your heart rate and causes you to sweat) each week. In addition, most adults need muscle-strengthening exercises on 2 or more days a week.  Maintain a healthy weight. The body mass index (BMI) is a screening tool to identify possible weight problems. It provides an estimate of body fat based on height and weight. Your caregiver can help determine your BMI, and can help you achieve or maintain a healthy weight.For adults 20 years and older:  A BMI below 18.5 is considered underweight.  A BMI of 18.5 to 24.9 is normal.  A BMI of 25 to 29.9 is considered overweight.  A BMI of 30 and above is  considered obese.  Maintain normal blood lipids and cholesterol levels by exercising and minimizing your intake of saturated fat. Eat a balanced diet with plenty of fruit and vegetables. Blood tests for lipids and cholesterol should begin at age 41 and be repeated every 5 years. If your lipid or cholesterol levels are high, you are over 50, or you are at high risk for heart disease, you may need your cholesterol levels checked more frequently.Ongoing high lipid and cholesterol levels should be treated with medicines if diet and exercise are not effective.  If you smoke, find out from your caregiver how to quit. If you do not use tobacco, do not start.  If you are pregnant, do not drink alcohol. If you are breastfeeding, be very cautious about drinking alcohol. If you are not pregnant and choose to drink alcohol, do not exceed 1 drink per day. One drink is considered to be 12 ounces (355 mL) of beer, 5 ounces (148 mL) of wine, or 1.5 ounces (44 mL) of liquor.  Avoid use of street drugs. Do not share needles with anyone. Ask for help if you need support or instructions about stopping the use of drugs.  High blood pressure causes heart disease and increases the risk of stroke. Your blood pressure should be checked at least every 1 to 2 years. Ongoing high blood pressure should be treated with medicines if weight loss and exercise are not effective.  If you are 65 to 46 years old, ask your caregiver if you should take aspirin to prevent strokes.  Diabetes  screening involves taking a blood sample to check your fasting blood sugar level. This should be done once every 3 years, after age 45, if you are within normal weight and without risk factors for diabetes. Testing should be considered at a younger age or be carried out more frequently if you are overweight and have at least 1 risk factor for diabetes.  Breast cancer screening is essential preventive care for women. You should practice "breast  self-awareness." This means understanding the normal appearance and feel of your breasts and may include breast self-examination. Any changes detected, no matter how small, should be reported to a caregiver. Women in their 20s and 30s should have a clinical breast exam (CBE) by a caregiver as part of a regular health exam every 1 to 3 years. After age 40, women should have a CBE every year. Starting at age 40, women should consider having a mammography (breast X-ray test) every year. Women who have a family history of breast cancer should talk to their caregiver about genetic screening. Women at a high risk of breast cancer should talk to their caregivers about having magnetic resonance imaging (MRI) and a mammography every year.  The Pap test is a screening test for cervical cancer. A Pap test can show cell changes on the cervix that might become cervical cancer if left untreated. A Pap test is a procedure in which cells are obtained and examined from the lower end of the uterus (cervix).  Women should have a Pap test starting at age 21.  Between ages 21 and 29, Pap tests should be repeated every 2 years.  Beginning at age 30, you should have a Pap test every 3 years as long as the past 3 Pap tests have been normal.  Some women have medical problems that increase the chance of getting cervical cancer. Talk to your caregiver about these problems. It is especially important to talk to your caregiver if a new problem develops soon after your last Pap test. In these cases, your caregiver may recommend more frequent screening and Pap tests.  The above recommendations are the same for women who have or have not gotten the vaccine for human papillomavirus (HPV).  If you had a hysterectomy for a problem that was not cancer or a condition that could lead to cancer, then you no longer need Pap tests. Even if you no longer need a Pap test, a regular exam is a good idea to make sure no other problems are  starting.  If you are between ages 65 and 70, and you have had normal Pap tests going back 10 years, you no longer need Pap tests. Even if you no longer need a Pap test, a regular exam is a good idea to make sure no other problems are starting.  If you have had past treatment for cervical cancer or a condition that could lead to cancer, you need Pap tests and screening for cancer for at least 20 years after your treatment.  If Pap tests have been discontinued, risk factors (such as a new sexual partner) need to be reassessed to determine if screening should be resumed.  The HPV test is an additional test that may be used for cervical cancer screening. The HPV test looks for the virus that can cause the cell changes on the cervix. The cells collected during the Pap test can be tested for HPV. The HPV test could be used to screen women aged 30 years and older, and should   be used in women of any age who have unclear Pap test results. After the age of 30, women should have HPV testing at the same frequency as a Pap test.  Colorectal cancer can be detected and often prevented. Most routine colorectal cancer screening begins at the age of 50 and continues through age 75. However, your caregiver may recommend screening at an earlier age if you have risk factors for colon cancer. On a yearly basis, your caregiver may provide home test kits to check for hidden blood in the stool. Use of a small camera at the end of a tube, to directly examine the colon (sigmoidoscopy or colonoscopy), can detect the earliest forms of colorectal cancer. Talk to your caregiver about this at age 50, when routine screening begins. Direct examination of the colon should be repeated every 5 to 10 years through age 75, unless early forms of pre-cancerous polyps or small growths are found.  Hepatitis C blood testing is recommended for all people born from 1945 through 1965 and any individual with known risks for hepatitis C.  Practice  safe sex. Use condoms and avoid high-risk sexual practices to reduce the spread of sexually transmitted infections (STIs). STIs include gonorrhea, chlamydia, syphilis, trichomonas, herpes, HPV, and human immunodeficiency virus (HIV). Herpes, HIV, and HPV are viral illnesses that have no cure. They can result in disability, cancer, and death. Sexually active women aged 25 and younger should be checked for chlamydia. Older women with new or multiple partners should also be tested for chlamydia. Testing for other STIs is recommended if you are sexually active and at increased risk.  Osteoporosis is a disease in which the bones lose minerals and strength with aging. This can result in serious bone fractures. The risk of osteoporosis can be identified using a bone density scan. Women ages 65 and over and women at risk for fractures or osteoporosis should discuss screening with their caregivers. Ask your caregiver whether you should take a calcium supplement or vitamin D to reduce the rate of osteoporosis.  Menopause can be associated with physical symptoms and risks. Hormone replacement therapy is available to decrease symptoms and risks. You should talk to your caregiver about whether hormone replacement therapy is right for you.  Use sunscreen with sun protection factor (SPF) of 30 or more. Apply sunscreen liberally and repeatedly throughout the day. You should seek shade when your shadow is shorter than you. Protect yourself by wearing long sleeves, pants, a wide-brimmed hat, and sunglasses year round, whenever you are outdoors.  Once a month, do a whole body skin exam, using a mirror to look at the skin on your back. Notify your caregiver of new moles, moles that have irregular borders, moles that are larger than a pencil eraser, or moles that have changed in shape or color.  Stay current with required immunizations.  Influenza. You need a dose every fall (or winter). The composition of the flu vaccine  changes each year, so being vaccinated once is not enough.  Pneumococcal polysaccharide. You need 1 to 2 doses if you smoke cigarettes or if you have certain chronic medical conditions. You need 1 dose at age 65 (or older) if you have never been vaccinated.  Tetanus, diphtheria, pertussis (Tdap, Td). Get 1 dose of Tdap vaccine if you are younger than age 65, are over 65 and have contact with an infant, are a healthcare worker, are pregnant, or simply want to be protected from whooping cough. After that, you need a Td   booster dose every 10 years. Consult your caregiver if you have not had at least 3 tetanus and diphtheria-containing shots sometime in your life or have a deep or dirty wound.  HPV. You need this vaccine if you are a woman age 26 or younger. The vaccine is given in 3 doses over 6 months.  Measles, mumps, rubella (MMR). You need at least 1 dose of MMR if you were born in 1957 or later. You may also need a second dose.  Meningococcal. If you are age 19 to 21 and a first-year college student living in a residence hall, or have one of several medical conditions, you need to get vaccinated against meningococcal disease. You may also need additional booster doses.  Zoster (shingles). If you are age 60 or older, you should get this vaccine.  Varicella (chickenpox). If you have never had chickenpox or you were vaccinated but received only 1 dose, talk to your caregiver to find out if you need this vaccine.  Hepatitis A. You need this vaccine if you have a specific risk factor for hepatitis A virus infection or you simply wish to be protected from this disease. The vaccine is usually given as 2 doses, 6 to 18 months apart.  Hepatitis B. You need this vaccine if you have a specific risk factor for hepatitis B virus infection or you simply wish to be protected from this disease. The vaccine is given in 3 doses, usually over 6 months. Preventive Services / Frequency Ages 19 to 39  Blood  pressure check.** / Every 1 to 2 years.  Lipid and cholesterol check.** / Every 5 years beginning at age 20.  Clinical breast exam.** / Every 3 years for women in their 20s and 30s.  Pap test.** / Every 2 years from ages 21 through 29. Every 3 years starting at age 30 through age 65 or 70 with a history of 3 consecutive normal Pap tests.  HPV screening.** / Every 3 years from ages 30 through ages 65 to 70 with a history of 3 consecutive normal Pap tests.  Hepatitis C blood test.** / For any individual with known risks for hepatitis C.  Skin self-exam. / Monthly.  Influenza immunization.** / Every year.  Pneumococcal polysaccharide immunization.** / 1 to 2 doses if you smoke cigarettes or if you have certain chronic medical conditions.  Tetanus, diphtheria, pertussis (Tdap, Td) immunization. / A one-time dose of Tdap vaccine. After that, you need a Td booster dose every 10 years.  HPV immunization. / 3 doses over 6 months, if you are 26 and younger.  Measles, mumps, rubella (MMR) immunization. / You need at least 1 dose of MMR if you were born in 1957 or later. You may also need a second dose.  Meningococcal immunization. / 1 dose if you are age 19 to 21 and a first-year college student living in a residence hall, or have one of several medical conditions, you need to get vaccinated against meningococcal disease. You may also need additional booster doses.  Varicella immunization.** / Consult your caregiver.  Hepatitis A immunization.** / Consult your caregiver. 2 doses, 6 to 18 months apart.  Hepatitis B immunization.** / Consult your caregiver. 3 doses usually over 6 months. Ages 40 to 64  Blood pressure check.** / Every 1 to 2 years.  Lipid and cholesterol check.** / Every 5 years beginning at age 20.  Clinical breast exam.** / Every year after age 40.  Mammogram.** / Every year beginning at age 40   and continuing for as long as you are in good health. Consult with your  caregiver.  Pap test.** / Every 3 years starting at age 30 through age 65 or 70 with a history of 3 consecutive normal Pap tests.  HPV screening.** / Every 3 years from ages 30 through ages 65 to 70 with a history of 3 consecutive normal Pap tests.  Fecal occult blood test (FOBT) of stool. / Every year beginning at age 50 and continuing until age 75. You may not need to do this test if you get a colonoscopy every 10 years.  Flexible sigmoidoscopy or colonoscopy.** / Every 5 years for a flexible sigmoidoscopy or every 10 years for a colonoscopy beginning at age 50 and continuing until age 75.  Hepatitis C blood test.** / For all people born from 1945 through 1965 and any individual with known risks for hepatitis C.  Skin self-exam. / Monthly.  Influenza immunization.** / Every year.  Pneumococcal polysaccharide immunization.** / 1 to 2 doses if you smoke cigarettes or if you have certain chronic medical conditions.  Tetanus, diphtheria, pertussis (Tdap, Td) immunization.** / A one-time dose of Tdap vaccine. After that, you need a Td booster dose every 10 years.  Measles, mumps, rubella (MMR) immunization. / You need at least 1 dose of MMR if you were born in 1957 or later. You may also need a second dose.  Varicella immunization.** / Consult your caregiver.  Meningococcal immunization.** / Consult your caregiver.  Hepatitis A immunization.** / Consult your caregiver. 2 doses, 6 to 18 months apart.  Hepatitis B immunization.** / Consult your caregiver. 3 doses, usually over 6 months. Ages 65 and over  Blood pressure check.** / Every 1 to 2 years.  Lipid and cholesterol check.** / Every 5 years beginning at age 20.  Clinical breast exam.** / Every year after age 40.  Mammogram.** / Every year beginning at age 40 and continuing for as long as you are in good health. Consult with your caregiver.  Pap test.** / Every 3 years starting at age 30 through age 65 or 70 with a 3  consecutive normal Pap tests. Testing can be stopped between 65 and 70 with 3 consecutive normal Pap tests and no abnormal Pap or HPV tests in the past 10 years.  HPV screening.** / Every 3 years from ages 30 through ages 65 or 70 with a history of 3 consecutive normal Pap tests. Testing can be stopped between 65 and 70 with 3 consecutive normal Pap tests and no abnormal Pap or HPV tests in the past 10 years.  Fecal occult blood test (FOBT) of stool. / Every year beginning at age 50 and continuing until age 75. You may not need to do this test if you get a colonoscopy every 10 years.  Flexible sigmoidoscopy or colonoscopy.** / Every 5 years for a flexible sigmoidoscopy or every 10 years for a colonoscopy beginning at age 50 and continuing until age 75.  Hepatitis C blood test.** / For all people born from 1945 through 1965 and any individual with known risks for hepatitis C.  Osteoporosis screening.** / A one-time screening for women ages 65 and over and women at risk for fractures or osteoporosis.  Skin self-exam. / Monthly.  Influenza immunization.** / Every year.  Pneumococcal polysaccharide immunization.** / 1 dose at age 65 (or older) if you have never been vaccinated.  Tetanus, diphtheria, pertussis (Tdap, Td) immunization. / A one-time dose of Tdap vaccine if you are over   65 and have contact with an infant, are a Research scientist (physical sciences), or simply want to be protected from whooping cough. After that, you need a Td booster dose every 10 years.  Varicella immunization.** / Consult your caregiver.  Meningococcal immunization.** / Consult your caregiver.  Hepatitis A immunization.** / Consult your caregiver. 2 doses, 6 to 18 months apart.  Hepatitis B immunization.** / Check with your caregiver. 3 doses, usually over 6 months. ** Family history and personal history of risk and conditions may change your caregiver's recommendations. Document Released: 09/28/2001 Document Revised: 10/25/2011  Document Reviewed: 12/28/2010 [facility] Patient Information 2014 [place], [place].  Thank you for enrolling in MyChart. Please follow the instructions below to securely access your online medical record. MyChart allows you to send messages to your doctor, view your test results, manage appointments, and more.   How Do I Sign Up? 1. In your Internet browser, go to [employer] and enter [email or web address]. 2. Click on the Sign Up Now link in the Sign In box. You will see the New Member Sign Up page. 3. Enter your MyChart Access Code exactly as it appears below. You will not need to use this code after you've completed the sign-up process. If you do not sign up before the expiration date, you must request a new code.  MyChart Access Code: [email or web address] Expires: 07/28/2013 10:47 AM  4. Enter your Social Security Number ([id number]) and Date of Birth (mm/dd/yyyy) as indicated and click Submit. You will be taken to the next sign-up page. 5. Create a MyChart ID. This will be your MyChart login ID and cannot be changed, so think of one that is secure and easy to remember. 6. Create a MyChart password. You can change your password at any time. 7. Enter your Password Reset Question and Answer. This can be used at a later time if you forget your password.  8. Enter your e-mail address. You will receive e-mail notification when new information is available in MyChart. 9. Click Sign Up. You can now view your medical record.   Additional Information Remember, MyChart is NOT to be used for urgent needs. For medical emergencies, dial 911.

## 2013-05-29 NOTE — Progress Notes (Signed)
Here today for gyn physical.  Has noticed her urine is dark and has odor.

## 2013-05-29 NOTE — Progress Notes (Signed)
  Subjective:     Megan Pollard is a 47 y.o. G3P3 female and is here for a comprehensive physical exam. The patient reports having urine with foul odor and looks dark.  Sexually active, no problems.   History   Social History  . Marital Status: Married    Spouse Name: N/A    Number of Children: N/A  . Years of Education: N/A   Occupational History  . Not on file.   Social History Main Topics  . Smoking status: Current Every Day Smoker -- 0.25 packs/day    Types: Cigarettes  . Smokeless tobacco: Current User  . Alcohol Use: Yes     Comment: once or twice yearly  . Drug Use: No  . Sexual Activity: Yes    Partners: Male    Birth Control/ Protection: Surgical     Comment: tubalization.   Other Topics Concern  . Not on file   Social History Narrative  . No narrative on file   Health Maintenance  Topic Date Due  . Tetanus/tdap  01/27/1986  . Influenza Vaccine  03/16/2013  . Pap Smear  04/12/2015    The following portions of the patient's history were reviewed and updated as appropriate: allergies, current medications, past family history, past medical history, past social history, past surgical history and problem list.  Review of Systems Pertinent items are noted in HPI.   Objective:   BP 154/97  Pulse 86  Ht 5\' 3"  (1.6 m)  Wt 155 lb (70.308 kg)  BMI 27.46 kg/m2  LMP 05/22/2013 GENERAL: Well-developed, well-nourished female in no acute distress.  HEENT: Normocephalic, atraumatic. Sclerae anicteric.  NECK: Supple. Normal thyroid.  LUNGS: Clear to auscultation bilaterally.  HEART: Regular rate and rhythm. BREASTS: Symmetric in size. No masses, skin changes, nipple drainage, or lymphadenopathy. ABDOMEN: Soft, nontender, nondistended. No organomegaly. PELVIC: Normal external female genitalia. Vagina is pink and rugated.  Normal discharge. Normal cervix contour. Pap smear obtained. Uterus is normal in size. No adnexal mass or tenderness.  EXTREMITIES: No cyanosis,  clubbing, or edema, 2+ distal pulses.  Labs: UA +mod LE, otherwise normal. Urine culture sent   Assessment:    Healthy female exam.  Urinary symptoms   Plan:   Pap done, will follow up results and manage accordingly. Mammogram scheduled Follow up urine culture and manage accordingly Routine preventative health maintenance measures emphasized

## 2013-05-31 LAB — URINE CULTURE

## 2013-05-31 MED ORDER — CEPHALEXIN 500 MG PO CAPS: 500.0000 mg | ORAL_CAPSULE | Freq: Four times a day (QID) | ORAL | Status: DC

## 2013-05-31 NOTE — Addendum Note (Signed)
Addended by: Jaynie Collins A on: 05/31/2013 05:08 PM   Modules accepted: Orders

## 2013-06-15 ENCOUNTER — Ambulatory Visit (HOSPITAL_COMMUNITY): Payer: PRIVATE HEALTH INSURANCE

## 2013-06-21 ENCOUNTER — Other Ambulatory Visit: Payer: Self-pay

## 2013-07-05 ENCOUNTER — Ambulatory Visit (HOSPITAL_COMMUNITY): Payer: PRIVATE HEALTH INSURANCE | Attending: Obstetrics & Gynecology

## 2014-01-17 ENCOUNTER — Ambulatory Visit (INDEPENDENT_AMBULATORY_CARE_PROVIDER_SITE_OTHER): Payer: PRIVATE HEALTH INSURANCE | Admitting: Obstetrics & Gynecology

## 2014-01-17 ENCOUNTER — Encounter: Payer: Self-pay | Admitting: Obstetrics & Gynecology

## 2014-01-17 VITALS — BP 129/93 | HR 80 | Ht 63.0 in | Wt 156.0 lb

## 2014-01-17 DIAGNOSIS — N920 Excessive and frequent menstruation with regular cycle: Secondary | ICD-10-CM

## 2014-01-17 LAB — CBC
HCT: 30.7 % — ABNORMAL LOW (ref 36.0–46.0)
Hemoglobin: 9.6 g/dL — ABNORMAL LOW (ref 12.0–15.0)
MCH: 24.1 pg — AB (ref 26.0–34.0)
MCHC: 31.3 g/dL (ref 30.0–36.0)
MCV: 77.1 fL — AB (ref 78.0–100.0)
PLATELETS: 244 10*3/uL (ref 150–400)
RBC: 3.98 MIL/uL (ref 3.87–5.11)
RDW: 15.9 % — AB (ref 11.5–15.5)
WBC: 6.9 10*3/uL (ref 4.0–10.5)

## 2014-01-17 NOTE — Progress Notes (Signed)
   Subjective:    Patient ID: Megan Pollard, female    DOB: Oct 13, 1965, 48 y.o.   MRN: [id number]  HPI  48 yo M G3P3(26, 22,23 yo kids) here today because her period lasted 2 weeks. This happened 5 years ago and she had a biopsy and it was reportedly normal. She tells me that she is anemic, last tested 5 years ago.  Review of Systems Mammogram due She had a BTL in the past.    Objective:   Physical Exam  NSSA, NT, mobile, normal adnexal exam      Assessment & Plan:  Menorrhagia/prolonged period with tiny uterus on exam I will check TSH, CBC and cultures. If this recurrs, then I would recommend cyclic provera, possible ultrasound

## 2014-01-18 LAB — TSH: TSH: 2.198 u[IU]/mL (ref 0.350–4.500)

## 2014-01-21 IMAGING — CR DG CHEST 2V
1 series · 3 of 3 positions shown · non-contrast
Comparison: none

REASON FOR EXAM: HTN
COMMENTS:   May transport without cardiac monitor

PROCEDURE:     DXR - DXR CHEST PA (OR AP) AND LATERAL  - October 27, 2011  [DATE]
RESULT:     The lung fields are clear. No pneumonia, pneumothorax or pleural
effusion is seen. The heart size is normal. The mediastinal and osseous
structures show no significant abnormalities.

[Series 1: w chest pa · 0.14mm/px · 3 of 3 slices shown]
[im 1/3]
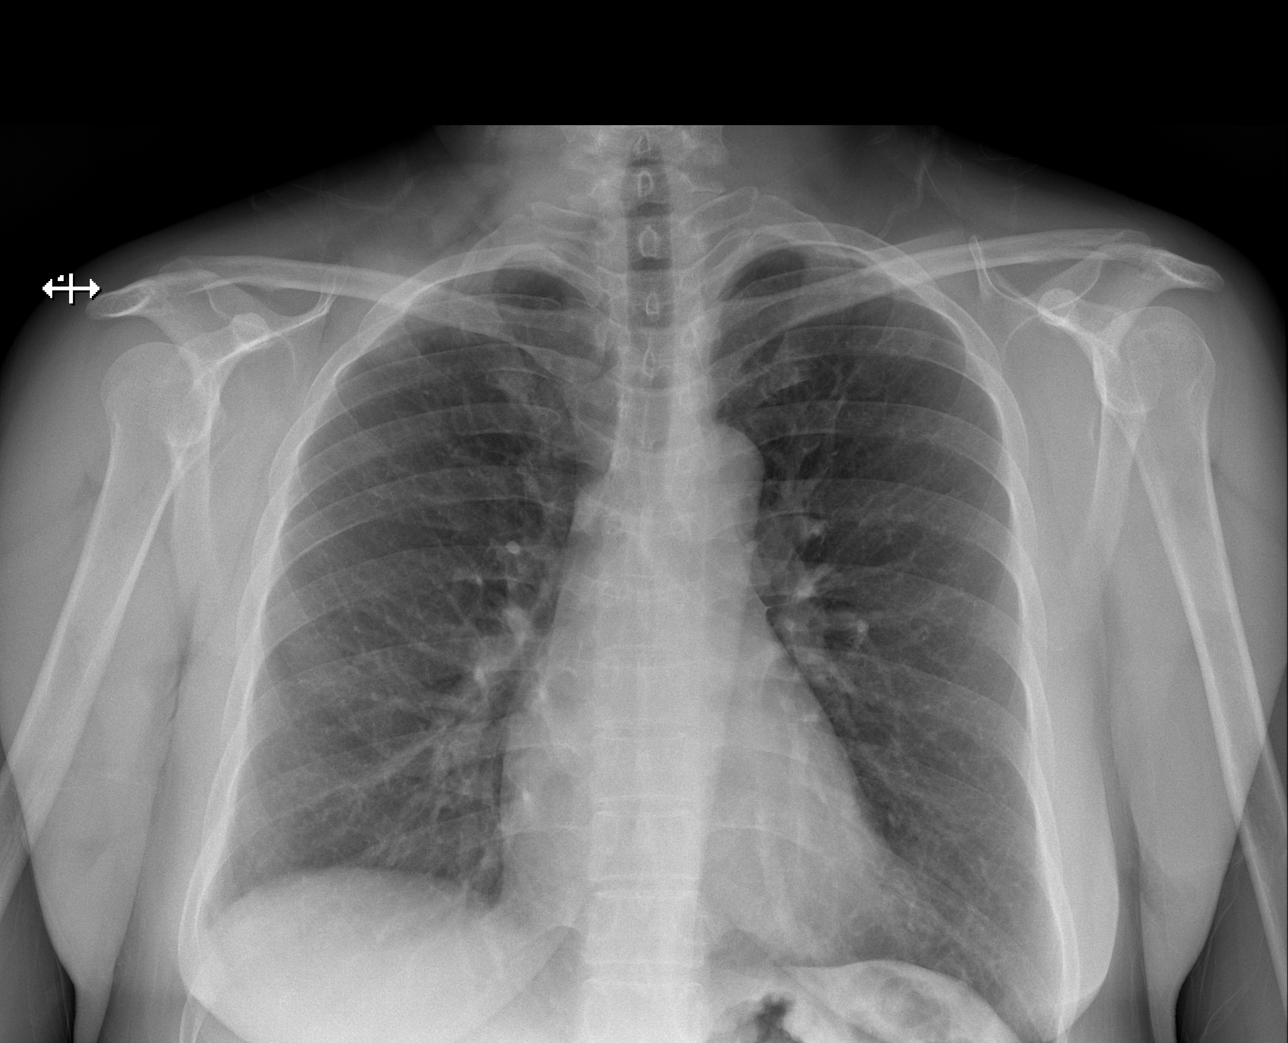
[im 2/3]
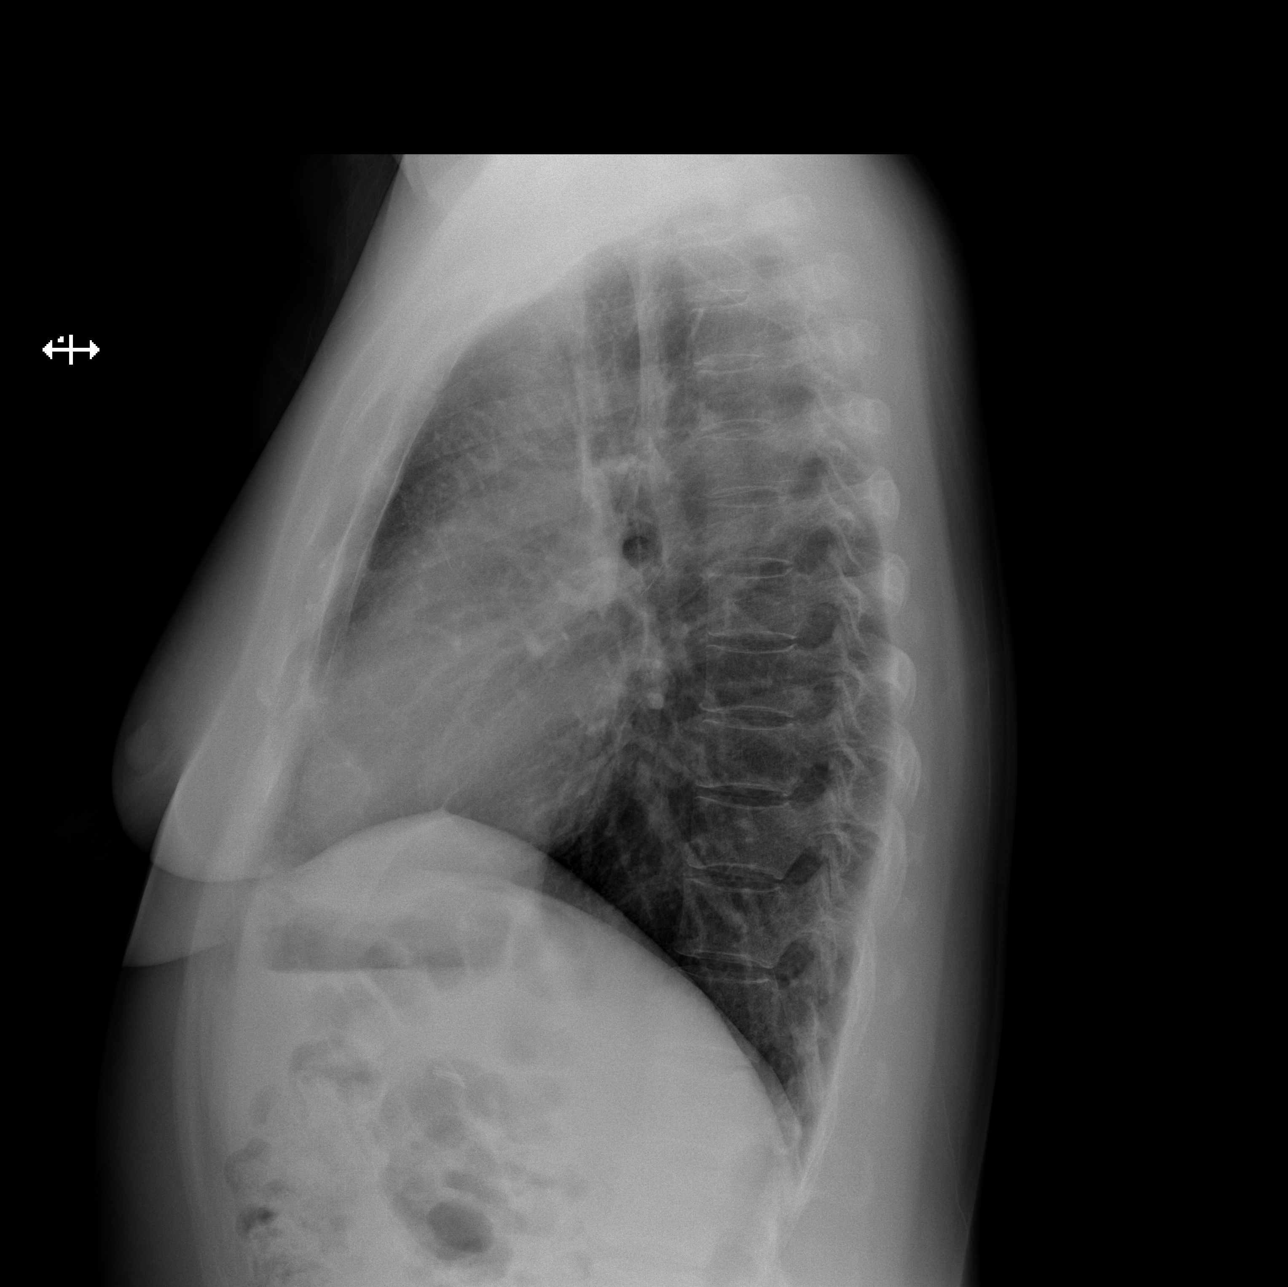
[im 3/3]
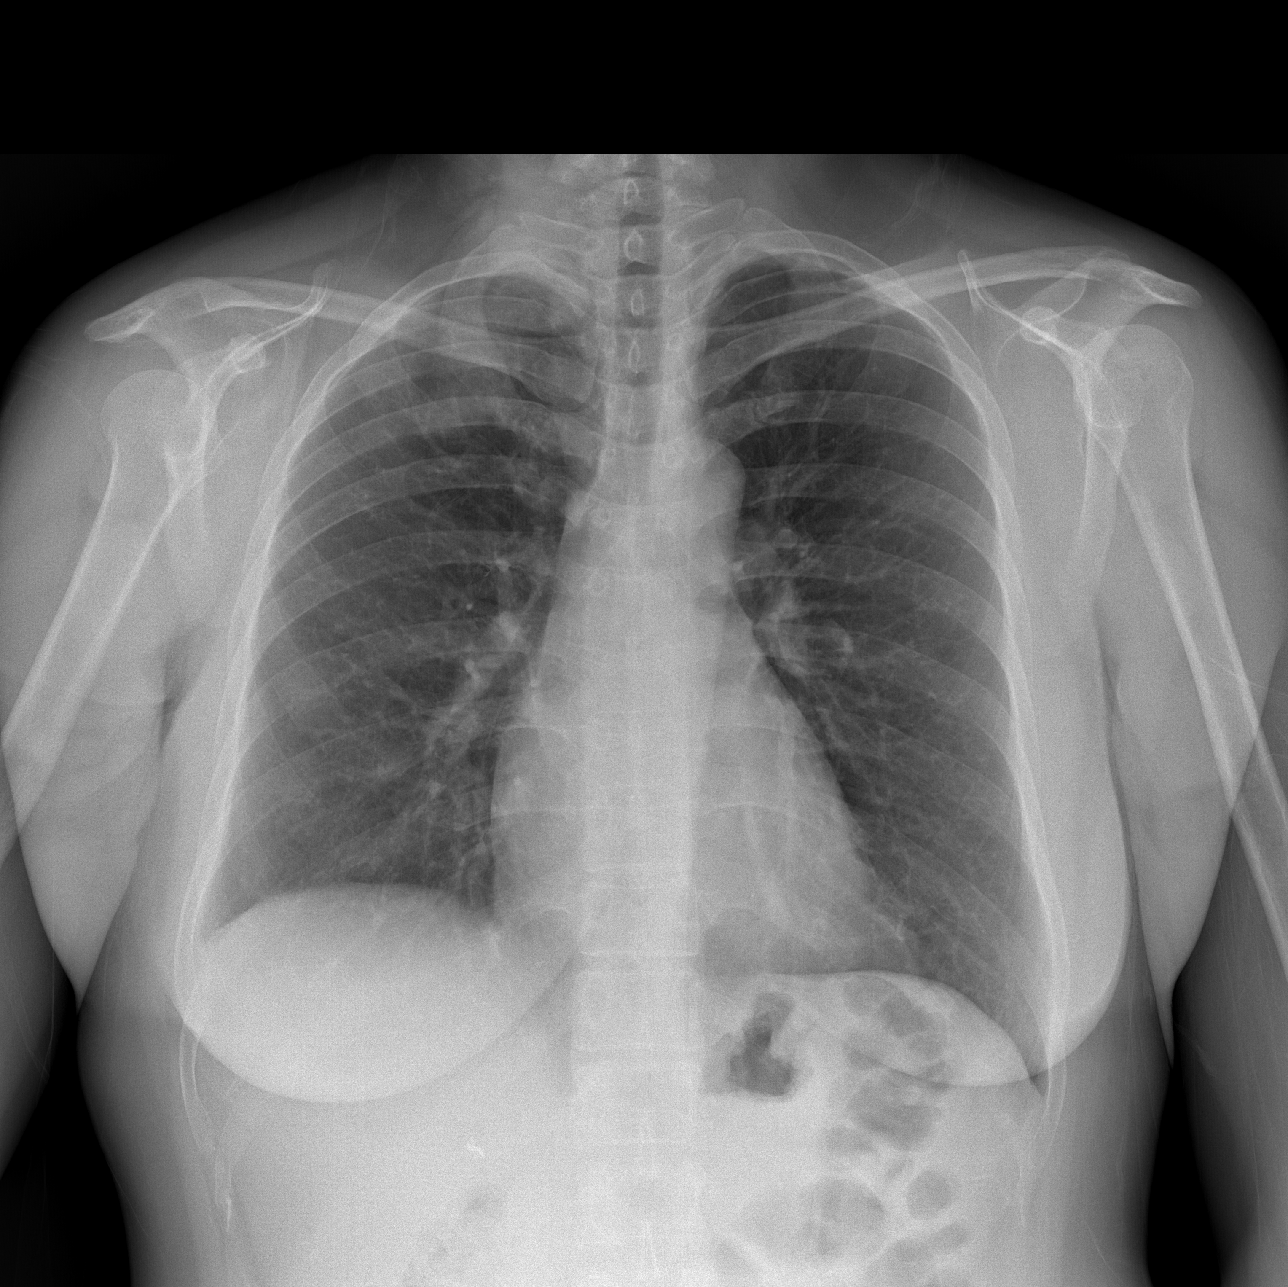

[3 of 3 positions shown; findings below may reference images not displayed]

IMPRESSION: No acute changes are identified.

## 2014-01-22 ENCOUNTER — Ambulatory Visit: Payer: PRIVATE HEALTH INSURANCE | Admitting: Obstetrics & Gynecology

## 2014-01-23 ENCOUNTER — Telehealth: Payer: Self-pay | Admitting: *Deleted

## 2014-01-23 NOTE — Telephone Encounter (Signed)
Notified patient of lab results 

## 2014-06-17 ENCOUNTER — Encounter: Payer: Self-pay | Admitting: Obstetrics & Gynecology

## 2014-08-13 ENCOUNTER — Emergency Department: Payer: Self-pay | Admitting: Emergency Medicine

## 2016-11-07 IMAGING — CR DG SHOULDER 3+V*R*
1 series · 3 of 3 positions shown · non-contrast
Comparison: None.

CLINICAL DATA: Two day history of pain. No known injury. Limited
range of motion.

EXAM:
DG SHOULDER 3+ VIEWS RIGHT

[Series 1: dxr shoulder right complete · 0.14mm/px · 3 of 3 slices shown]
[im 1/3]
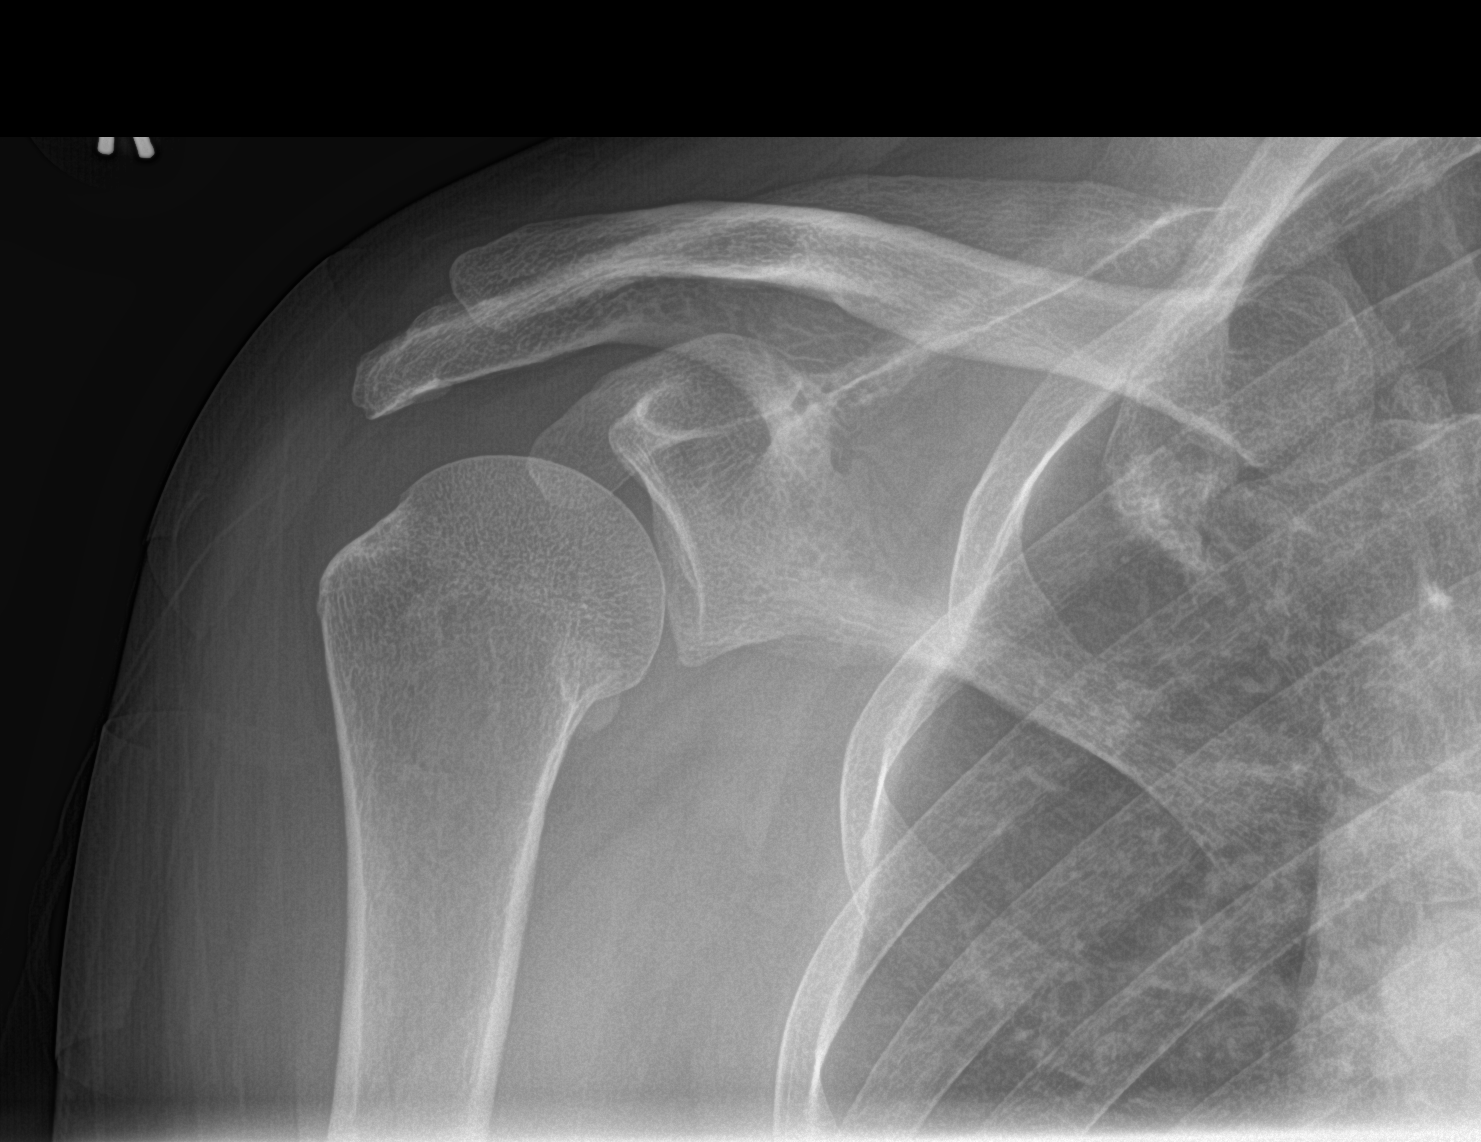
[im 2/3]
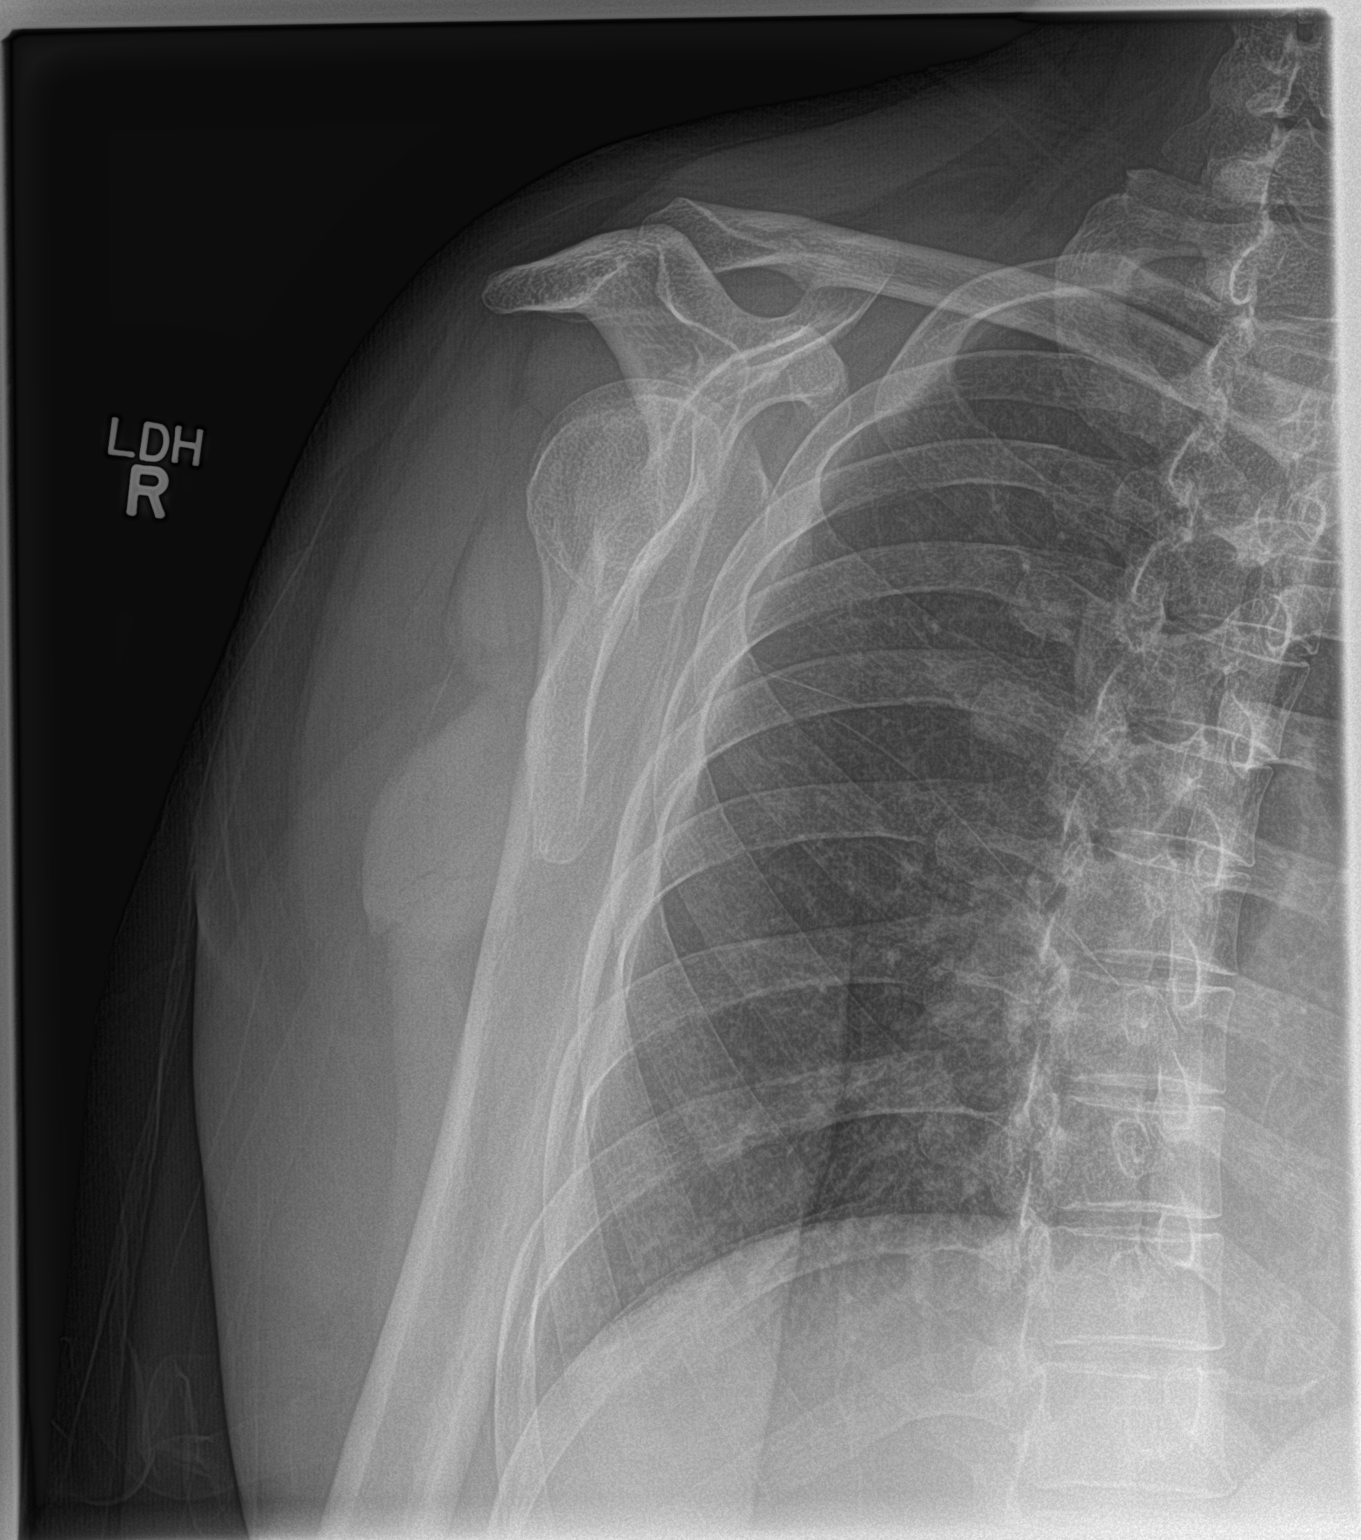
[im 3/3]
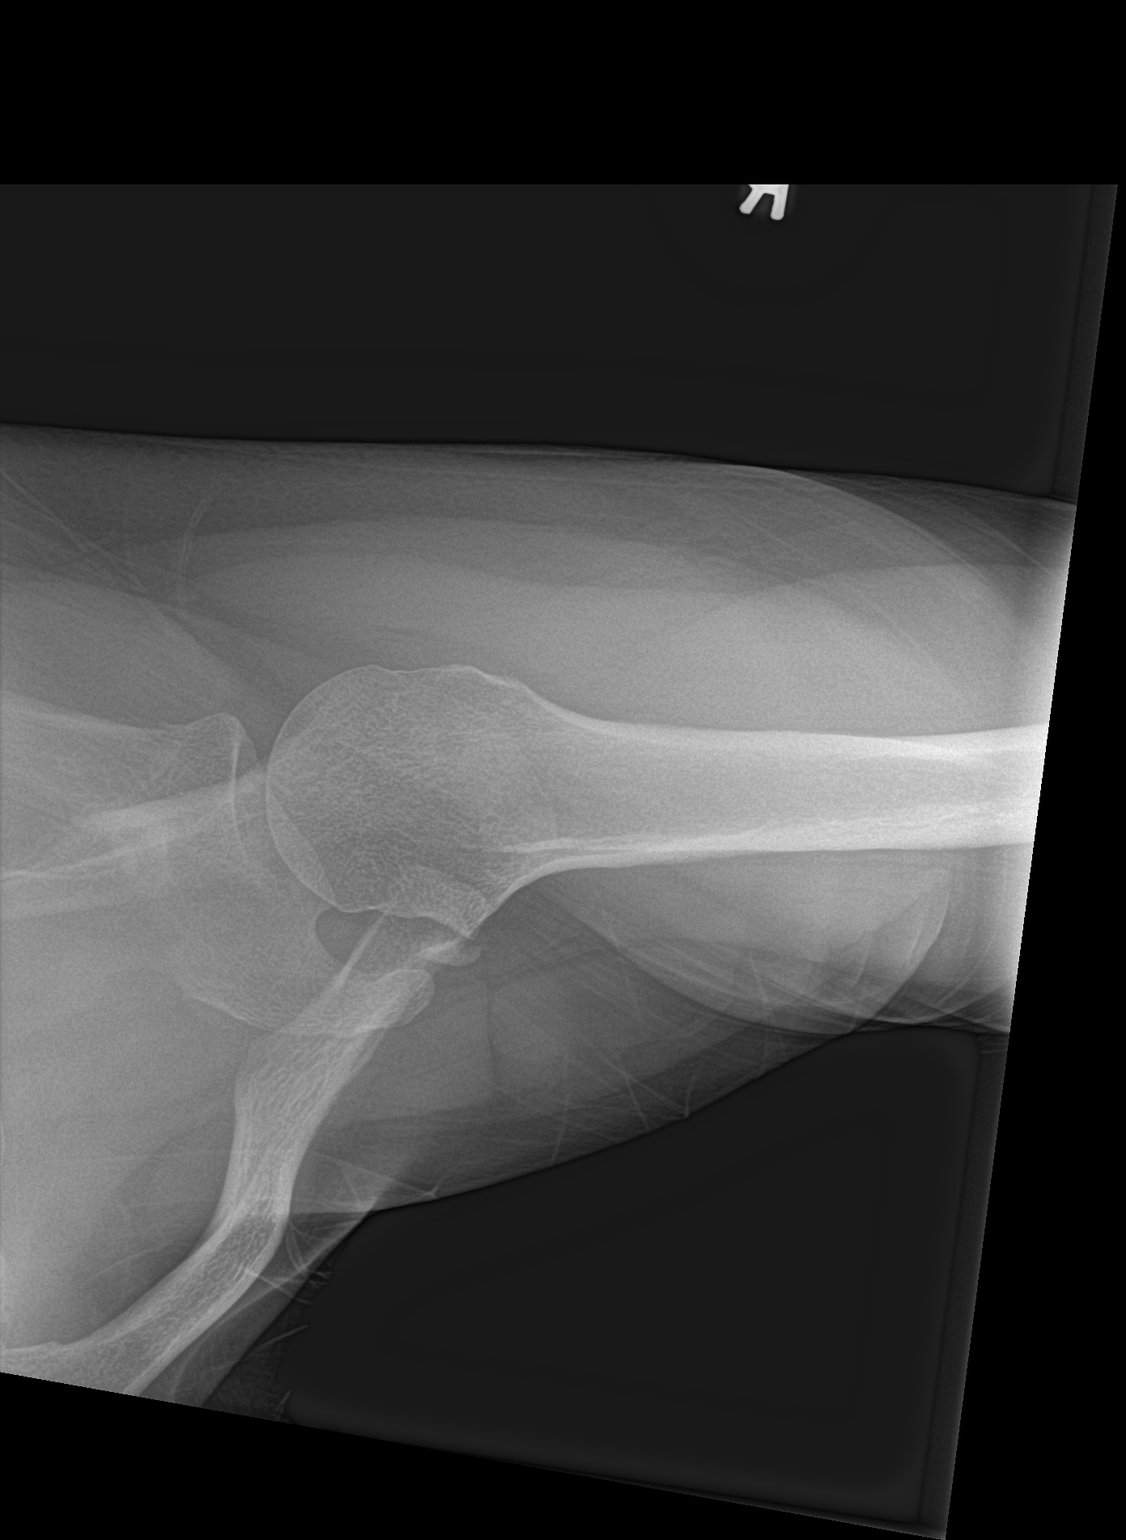

[3 of 3 positions shown; findings below may reference images not displayed]

FINDINGS: Frontal, Y scapular, and axillary images were obtained. There is a
focal area of ossification adjacent to the lesser tuberosity which
appears to be consistent with an avulsion type injury of uncertain
age. No other evidence of fracture. No dislocation. Joint spaces
appear intact. No erosive change.
IMPRESSION: Age uncertain avulsion type injury adjacent to the lesser
tuberosity. No other evidence suggesting fracture. No dislocation.
No appreciable arthropathic change.

## 2017-03-02 ENCOUNTER — Encounter: Payer: Self-pay | Admitting: Obstetrics & Gynecology

## 2017-03-02 ENCOUNTER — Ambulatory Visit (INDEPENDENT_AMBULATORY_CARE_PROVIDER_SITE_OTHER): Payer: BLUE CROSS/BLUE SHIELD | Admitting: Obstetrics & Gynecology

## 2017-03-02 VITALS — BP 142/92 | HR 72 | Resp 18 | Ht 62.0 in | Wt 164.0 lb

## 2017-03-02 DIAGNOSIS — Z01419 Encounter for gynecological examination (general) (routine) without abnormal findings: Secondary | ICD-10-CM

## 2017-03-02 DIAGNOSIS — Z1231 Encounter for screening mammogram for malignant neoplasm of breast: Secondary | ICD-10-CM

## 2017-03-02 DIAGNOSIS — Z1151 Encounter for screening for human papillomavirus (HPV): Secondary | ICD-10-CM | POA: Diagnosis not present

## 2017-03-02 DIAGNOSIS — Z113 Encounter for screening for infections with a predominantly sexual mode of transmission: Secondary | ICD-10-CM

## 2017-03-02 DIAGNOSIS — Z124 Encounter for screening for malignant neoplasm of cervix: Secondary | ICD-10-CM

## 2017-03-02 DIAGNOSIS — A5901 Trichomonal vulvovaginitis: Secondary | ICD-10-CM | POA: Insufficient documentation

## 2017-03-02 DIAGNOSIS — N760 Acute vaginitis: Secondary | ICD-10-CM

## 2017-03-02 DIAGNOSIS — B9689 Other specified bacterial agents as the cause of diseases classified elsewhere: Secondary | ICD-10-CM

## 2017-03-02 DIAGNOSIS — N939 Abnormal uterine and vaginal bleeding, unspecified: Secondary | ICD-10-CM

## 2017-03-02 MED ORDER — MEGESTROL ACETATE 40 MG PO TABS: 40.0000 mg | ORAL_TABLET | Freq: Every day | ORAL | 5 refills | Status: AC

## 2017-03-02 NOTE — Patient Instructions (Signed)
Dysfunctional Uterine Bleeding Dysfunctional uterine bleeding is abnormal bleeding from the uterus. Dysfunctional uterine bleeding includes:  A period that comes earlier or later than usual.  A period that is lighter, heavier, or has blood clots.  Bleeding between periods.  Skipping one or more periods.  Bleeding after sexual intercourse.  Bleeding after menopause.  Follow these instructions at home: Pay attention to any changes in your symptoms. Follow these instructions to help with your condition: Eating and drinking  Eat well-balanced meals. Include foods that are high in iron, such as liver, meat, shellfish, green leafy vegetables, and eggs.  If you become constipated: ? Drink plenty of water. ? Eat fruits and vegetables that are high in water and fiber, such as spinach, carrots, raspberries, apples, and mango. Medicines  Take over-the-counter and prescription medicines only as told by your health care provider.  Do not change medicines without talking with your health care provider.  Aspirin or medicines that contain aspirin may make the bleeding worse. Do not take those medicines: ? During the week before your period. ? During your period.  If you were prescribed iron pills, take them as told by your health care provider. Iron pills help to replace iron that your body loses because of this condition. Activity  If you need to change your sanitary pad or tampon more than one time every 2 hours: ? Lie in bed with your feet raised (elevated). ? Place a cold pack on your lower abdomen. ? Rest as much as possible until the bleeding stops or slows down.  Do not try to lose weight until the bleeding has stopped and your blood iron level is back to normal. Other Instructions  For two months, write down: ? When your period starts. ? When your period ends. ? When any abnormal bleeding occurs. ? What problems you notice.  Keep all follow up visits as told by your  health care provider. This is important. Contact a health care provider if:  You get light-headed or weak.  You have nausea and vomiting.  You cannot eat or drink without vomiting.  You feel dizzy or have diarrhea while you are taking medicines.  You are taking birth control pills or hormones, and you want to change them or stop taking them. Get help right away if:  You develop a fever or chills.  You need to change your sanitary pad or tampon more than one time per hour.  Your bleeding becomes heavier, or your flow contains clots more often.  You develop pain in your abdomen.  You lose consciousness.  You develop a rash. This information is not intended to replace advice given to you by your health care provider. Make sure you discuss any questions you have with your health care provider. Document Released: 07/30/2000 Document Revised: 01/08/2016 Document Reviewed: 10/28/2014 Elsevier Interactive Patient Education  2018 [place] Years, Female Preventive care refers to lifestyle choices and visits with your health care provider that can promote health and wellness. What does preventive care include?  A yearly physical exam. This is also called an annual well check.  Dental exams once or twice a year.  Routine eye exams. Ask your health care provider how often you should have your eyes checked.  Personal lifestyle choices, including: ? Daily care of your teeth and gums. ? Regular physical activity. ? Eating a healthy diet. ? Avoiding tobacco and drug use. ? Limiting alcohol use. ? Practicing safe sex. ?  Taking low-dose aspirin daily starting at age 24. ? Taking vitamin and mineral supplements as recommended by your health care provider. What happens during an annual well check? The services and screenings done by your health care provider during your annual well check will depend on your age, overall health, lifestyle risk factors, and  family history of disease. Counseling Your health care provider may ask you questions about your:  Alcohol use.  Tobacco use.  Drug use.  Emotional well-being.  Home and relationship well-being.  Sexual activity.  Eating habits.  Work and work Statistician.  Method of birth control.  Menstrual cycle.  Pregnancy history.  Screening You may have the following tests or measurements:  Height, weight, and BMI.  Blood pressure.  Lipid and cholesterol levels. These may be checked every 5 years, or more frequently if you are over 30 years old.  Skin check.  Lung cancer screening. You may have this screening every year starting at age 74 if you have a 30-pack-year history of smoking and currently smoke or have quit within the past 15 years.  Fecal occult blood test (FOBT) of the stool. You may have this test every year starting at age 66.  Flexible sigmoidoscopy or colonoscopy. You may have a sigmoidoscopy every 5 years or a colonoscopy every 10 years starting at age 43.  Hepatitis C blood test.  Hepatitis B blood test.  Sexually transmitted disease (STD) testing.  Diabetes screening. This is done by checking your blood sugar (glucose) after you have not eaten for a while (fasting). You may have this done every 1-3 years.  Mammogram. This may be done every 1-2 years. Talk to your health care provider about when you should start having regular mammograms. This may depend on whether you have a family history of breast cancer.  BRCA-related cancer screening. This may be done if you have a family history of breast, ovarian, tubal, or peritoneal cancers.  Pelvic exam and Pap test. This may be done every 3 years starting at age 71. Starting at age 27, this may be done every 5 years if you have a Pap test in combination with an HPV test.  Bone density scan. This is done to screen for osteoporosis. You may have this scan if you are at high risk for osteoporosis.  Discuss your  test results, treatment options, and if necessary, the need for more tests with your health care provider. Vaccines Your health care provider may recommend certain vaccines, such as:  Influenza vaccine. This is recommended every year.  Tetanus, diphtheria, and acellular pertussis (Tdap, Td) vaccine. You may need a Td booster every 10 years.  Varicella vaccine. You may need this if you have not been vaccinated.  Zoster vaccine. You may need this after age 80.  Measles, mumps, and rubella (MMR) vaccine. You may need at least one dose of MMR if you were born in 1957 or later. You may also need a second dose.  Pneumococcal 13-valent conjugate (PCV13) vaccine. You may need this if you have certain conditions and were not previously vaccinated.  Pneumococcal polysaccharide (PPSV23) vaccine. You may need one or two doses if you smoke cigarettes or if you have certain conditions.  Meningococcal vaccine. You may need this if you have certain conditions.  Hepatitis A vaccine. You may need this if you have certain conditions or if you travel or work in places where you may be exposed to hepatitis A.  Hepatitis B vaccine. You may need this if you  have certain conditions or if you travel or work in places where you may be exposed to hepatitis B.  Haemophilus influenzae type b (Hib) vaccine. You may need this if you have certain conditions.  Talk to your health care provider about which screenings and vaccines you need and how often you need them. This information is not intended to replace advice given to you by your health care provider. Make sure you discuss any questions you have with your health care provider. Document Released: 08/29/2015 Document Revised: 04/21/2016 Document Reviewed: 06/03/2015 Elsevier Interactive Patient Education  2017 [employer].

## 2017-03-02 NOTE — Progress Notes (Signed)
GYNECOLOGY ANNUAL PREVENTATIVE CARE ENCOUNTER NOTE  Subjective:   Megan Pollard is a 51 y.o. G3P3 female here for a routine annual gynecologic exam.  Current complaints: irregular bleeding for the past few months. Missed a few periods but then had small amount of bleeding for most days for the past month. No lightheadedness, dizziness. No associated cramping or other symptoms.  Had normal TSH analysis at her PCP, and normal CBC 2 months ago, but desires repeat CBC analysis to make sure she is not anemic (has no symptoms).   Denies abnormal vaginal discharge, pelvic pain, problems with intercourse or other gynecologic concerns.    Gynecologic History Patient's last menstrual period was 01/24/2017. Contraception: tubal ligation Last Pap: 2014. Results were: normal Last mammogram: 2013. Results were: normal  Obstetric History OB History  Gravida Para Term Preterm AB Living  3 3       3   SAB TAB Ectopic Multiple Live Births          3    # Outcome Date GA Lbr Len/2nd Weight Sex Delivery Anes PTL Lv  3 Para      CS-LTranv   LIV  2 Para      CS-LTranv   LIV  1 Para      CS-LTranv   LIV      Past Medical History:  Diagnosis Date  . Anemia   . Hypertension     Past Surgical History:  Procedure Laterality Date  . CESAREAN SECTION     x3  . ENDOMETRIAL BIOPSY  2009   anemia  . SHOULDER SURGERY     left shoulder  . TUBAL LIGATION     x 46yrs    No current outpatient prescriptions on file prior to visit.   No current facility-administered medications on file prior to visit.     No Known Allergies  Social History   Social History  . Marital status: Married    Spouse name: N/A  . Number of children: N/A  . Years of education: N/A   Occupational History  . Not on file.   Social History Main Topics  . Smoking status: Current Some Day Smoker    Packs/day: 0.00    Types: Cigarettes  . Smokeless tobacco: Current User     Comment: socially  . Alcohol use Yes   Comment: once or twice yearly  . Drug use: No  . Sexual activity: Yes    Partners: Male    Birth control/ protection: Surgical     Comment: tubalization.   Other Topics Concern  . Not on file   Social History Narrative  . No narrative on file    Family History  Problem Relation Age of Onset  . Hypertension Mother   . Diabetes Mother   . Hypertension Father     The following portions of the patient's history were reviewed and updated as appropriate: allergies, current medications, past family history, past medical history, past social history, past surgical history and problem list.  Review of Systems Pertinent items noted in HPI and remainder of comprehensive ROS otherwise negative.   Objective:  BP (!) 142/92   Pulse 72   Resp 18   Ht 5\' 2"  (1.575 m)   Wt 164 lb (74.4 kg)   LMP 01/24/2017   BMI 30.00 kg/m  CONSTITUTIONAL: Well-developed, well-nourished female in no acute distress.  HENT:  Normocephalic, atraumatic, External right and left ear normal. Oropharynx is clear and moist EYES: Conjunctivae and EOM  are normal. Pupils are equal, round, and reactive to light. No scleral icterus.  NECK: Normal range of motion, supple, no masses.  Normal thyroid.  SKIN: Skin is warm and dry. No rash noted. Not diaphoretic. No erythema. No pallor. NEUROLOGIC: Alert and oriented to person, place, and time. Normal reflexes, muscle tone coordination. No cranial nerve deficit noted. PSYCHIATRIC: Normal mood and affect. Normal behavior. Normal judgment and thought content. CARDIOVASCULAR: Normal heart rate noted, regular rhythm RESPIRATORY: Clear to auscultation bilaterally. Effort and breath sounds normal, no problems with respiration noted. BREASTS: Symmetric in size. No masses, skin changes, nipple drainage, or lymphadenopathy. ABDOMEN: Soft, normal bowel sounds, no distention noted.  No tenderness, rebound or guarding.  PELVIC: Normal appearing external genitalia; normal appearing  vaginal mucosa and cervix.  Small amount of active bleeding noted, small clots cleared from vagina.  Pap smear obtained.  Normal uterine size, no other palpable masses, no uterine or adnexal tenderness. MUSCULOSKELETAL: Normal range of motion. No tenderness.  No cyanosis, clubbing, or edema.  2+ distal pulses.   Assessment and Plan:  1. Encounter for gynecological examination with Papanicolaou smear of cervix Will follow up results of pap smear and manage accordingly. - Cytology - PAP  2. Visit for screening mammogram Mammogram scheduled - [facility] SCREENING BREAST TOMO BILATERAL; Future  2. Abnormal uterine bleeding (AUB) CBC, pelvic ultrasound ordered. Recommended trial of Megace for control of AUB, patient agreed and this was prescribed. Will follow up all results and manage accordingly. - Cytology - PAP - CBC - US Pelvis Complete; Future - US Transvaginal Non-OB; Future - megestrol (MEGACE) 40 MG tablet; Take 1 tablet (40 mg total) by mouth daily. Can increase to two tablets a day for control of bleeding  Dispense: 60 tablet; Refill: Bleeding precautions advised.   Routine preventative health maintenance measures emphasized. Please refer to After Visit Summary for other counseling recommendations.    Jaynie CollinsUGONNA  Kamyah Wilhelmsen, MD, FACOG Attending Obstetrician & Gynecologist, [facility]

## 2017-03-03 LAB — CBC
HEMATOCRIT: 36 % (ref 34.0–46.6)
HEMOGLOBIN: 11.2 g/dL (ref 11.1–15.9)
MCH: 26.4 pg — ABNORMAL LOW (ref 26.6–33.0)
MCHC: 31.1 g/dL — ABNORMAL LOW (ref 31.5–35.7)
MCV: 85 fL (ref 79–97)
Platelets: 269 10*3/uL (ref 150–379)
RBC: 4.25 x10E6/uL (ref 3.77–5.28)
RDW: 15.7 % — ABNORMAL HIGH (ref 12.3–15.4)
WBC: 7.3 10*3/uL (ref 3.4–10.8)

## 2017-03-04 LAB — CYTOLOGY - PAP
BACTERIAL VAGINITIS: POSITIVE — AB
CANDIDA VAGINITIS: NEGATIVE
CHLAMYDIA, DNA PROBE: NEGATIVE
Diagnosis: NEGATIVE
HPV (WINDOPATH): NOT DETECTED
NEISSERIA GONORRHEA: NEGATIVE
Trichomonas: POSITIVE — AB

## 2017-03-04 MED ORDER — METRONIDAZOLE 500 MG PO TABS: 500.0000 mg | ORAL_TABLET | Freq: Two times a day (BID) | ORAL | 3 refills | Status: DC

## 2017-03-04 NOTE — Addendum Note (Signed)
Addended by: Jaynie CollinsANYANWU, Aayansh Codispoti A on: 03/04/2017 09:53 PM   Modules accepted: Orders

## 2017-03-09 ENCOUNTER — Ambulatory Visit
Admission: RE | Admit: 2017-03-09 | Discharge: 2017-03-09 | Disposition: A | Discharge status: Home / Self Care | Payer: BLUE CROSS/BLUE SHIELD | Source: Ambulatory Visit | Attending: Obstetrics & Gynecology | Admitting: Obstetrics & Gynecology

## 2017-03-09 DIAGNOSIS — N858 Other specified noninflammatory disorders of uterus: Secondary | ICD-10-CM | POA: Diagnosis not present

## 2017-03-09 DIAGNOSIS — N939 Abnormal uterine and vaginal bleeding, unspecified: Secondary | ICD-10-CM

## 2017-03-25 ENCOUNTER — Ambulatory Visit (INDEPENDENT_AMBULATORY_CARE_PROVIDER_SITE_OTHER): Payer: BLUE CROSS/BLUE SHIELD | Admitting: Obstetrics & Gynecology

## 2017-03-25 ENCOUNTER — Encounter (HOSPITAL_COMMUNITY): Payer: Self-pay

## 2017-03-25 ENCOUNTER — Encounter: Payer: Self-pay | Admitting: Obstetrics & Gynecology

## 2017-03-25 VITALS — BP 147/96 | HR 93 | Ht 62.0 in | Wt 164.0 lb

## 2017-03-25 DIAGNOSIS — Z202 Contact with and (suspected) exposure to infections with a predominantly sexual mode of transmission: Secondary | ICD-10-CM

## 2017-03-25 DIAGNOSIS — B9689 Other specified bacterial agents as the cause of diseases classified elsewhere: Secondary | ICD-10-CM

## 2017-03-25 DIAGNOSIS — Z113 Encounter for screening for infections with a predominantly sexual mode of transmission: Secondary | ICD-10-CM | POA: Diagnosis not present

## 2017-03-25 DIAGNOSIS — N760 Acute vaginitis: Secondary | ICD-10-CM

## 2017-03-25 DIAGNOSIS — A5901 Trichomonal vulvovaginitis: Secondary | ICD-10-CM | POA: Diagnosis not present

## 2017-03-25 DIAGNOSIS — N939 Abnormal uterine and vaginal bleeding, unspecified: Secondary | ICD-10-CM | POA: Diagnosis not present

## 2017-03-25 NOTE — Patient Instructions (Addendum)
Return to clinic for any scheduled appointments or for any gynecologic concerns as needed.    Hysteroscopy Hysteroscopy is a procedure used for looking inside the womb (uterus). It may be done for various reasons, including:  To evaluate abnormal bleeding, fibroid (benign, noncancerous) tumors, polyps, scar tissue (adhesions), and possibly cancer of the uterus.  To look for lumps (tumors) and other uterine growths.  To look for causes of why a woman cannot get pregnant (infertility), causes of recurrent loss of pregnancy (miscarriages), or a lost intrauterine device (IUD).  To perform a sterilization by blocking the fallopian tubes from inside the uterus.  In this procedure, a thin, flexible tube with a tiny light and camera on the end of it (hysteroscope) is used to look inside the uterus. A hysteroscopy should be done right after a menstrual period to be sure you are not pregnant. LET [facility] PROVID[facility] KNOW ABOUT:  Any allergies you have.  All medicines you are taking, including vitamins, herbs, eye drops, creams, and over-the-counter medicines.  Previous problems you or members of your family have had with the use of anesthetics.  Any blood disorders you have.  Previous surgeries you have had.  Medical conditions you have. RISKS AND COMPLICATIONS Generally, this is a safe procedure. However, as with any procedure, complications can occur. Possible complications include:  Putting a hole in the uterus.  Excessive bleeding.  Infection.  Damage to the cervix.  Injury to other organs.  Allergic reaction to medicines.  Too much fluid used in the uterus for the procedure.  BEFORE THE PROCEDURE  Ask your health care provider about changing or stopping any regular medicines.  Do not take aspirin or blood thinners for 1 week before the procedure, or as directed by your health care provider. These can cause bleeding.  If you smoke, do not smoke for 2 weeks before  the procedure.  In some cases, a medicine is placed in the cervix the day before the procedure. This medicine makes the cervix have a larger opening (dilate). This makes it easier for the instrument to be inserted into the uterus during the procedure.  Do not eat or drink anything for at least 8 hours before the surgery.  Arrange for someone to take you home after the procedure. PROCEDURE  You may be given a medicine to relax you (sedative). You may also be given one of the following: ? A medicine that numbs the area around the cervix (local anesthetic). ? A medicine that makes you sleep through the procedure (general anesthetic).  The hysteroscope is inserted through the vagina into the uterus. The camera on the hysteroscope sends a picture to a TV screen. This gives the surgeon a good view inside the uterus.  During the procedure, air or a liquid is put into the uterus, which allows the surgeon to see better.  Sometimes, tissue is gently scraped from inside the uterus. These tissue samples are sent to a lab for testing. What to expect after the procedure  If you had a general anesthetic, you may be groggy for a couple hours after the procedure.  If you had a local anesthetic, you will be able to go home as soon as you are stable and feel ready.  You may have some cramping. This normally lasts for a couple days.  You may have bleeding, which varies from light spotting for a few days to menstrual-like bleeding for 3-7 days. This is normal.  If your test results are  not back during the visit, make an appointment with your health care provider to find out the results. This information is not intended to replace advice given to you by your health care provider. Make sure you discuss any questions you have with your health care provider. Document Released: 03/2 /2002 Document Revised: 01/08/2016 Document Reviewed: 03/01/2013 Elsevier Interactive Patient Education  2017 [employer]   Endometrial Ablation Endometrial ablation is a procedure that destroys the thin inner layer of the lining of the uterus (endometrium). This procedure may be done:  To stop heavy periods.  To stop bleeding that is causing anemia.  To control irregular bleeding.  To treat bleeding caused by small tumors (fibroids) in the endometrium.  This procedure is often an alternative to major surgery, such as removal of the uterus and cervix (hysterectomy). As a result of this procedure:  You may not be able to have children. However, if you are premenopausal (you have not gone through menopause): ? You may still have a small chance of getting pregnant. ? You will need to use a reliable method of birth control after the procedure to prevent pregnancy.  You may stop having a menstrual period, or you may have only a small amount of bleeding during your period. Menstruation may return several years after the procedure.  Tell a health care provider about:  Any allergies you have.  All medicines you are taking, including vitamins, herbs, eye drops, creams, and over-the-counter medicines.  Any problems you or family members have had with the use of anesthetic medicines.  Any blood disorders you have.  Any surgeries you have had.  Any medical conditions you have. What are the risks? Generally, this is a safe procedure. However, problems may occur, including:  A hole (perforation) in the uterus or bowel.  Infection of the uterus, bladder, or vagina.  Bleeding.  Damage to other structures or organs.  An air bubble in the lung (air embolus).  Problems with pregnancy after the procedure.  Failure of the procedure.  Decreased ability to diagnose cancer in the endometrium.  What happens before the procedure?  You will have tests of your endometrium to make sure there are no pre-cancerous cells or cancer cells present.  You may have an ultrasound of the uterus.  You may be  given medicines to thin the endometrium.  Ask your health care provider about: ? Changing or stopping your regular medicines. This is especially important if you take diabetes medicines or blood thinners. ? Taking medicines such as aspirin and ibuprofen. These medicines can thin your blood. Do not take these medicines before your procedure if your doctor tells you not to.  Plan to have someone take you home from the hospital or clinic. What happens during the procedure?  You will lie on an exam table with your feet and legs supported as in a pelvic exam.  To lower your risk of infection: ? Your health care team will wash or sanitize their hands and put on germ-free (sterile) gloves. ? Your genital area will be washed with soap.  An IV tube will be inserted into one of your veins.  You will be given a medicine to help you relax (sedative).  A surgical instrument with a light and camera (resectoscope) will be inserted into your vagina and moved into your uterus. This allows your surgeon to see inside your uterus.  Endometrial tissue will be removed using one of the following methods: ? Radiofrequency. This method uses a  radiofrequency-alternating electric current to remove the endometrium. ? Cryotherapy. This method uses extreme cold to freeze the endometrium. ? Heated-free liquid. This method uses a heated saltwater (saline) solution to remove the endometrium. ? Microwave. This method uses high-energy microwaves to heat up the endometrium and remove it. ? Thermal balloon. This method involves inserting a catheter with a balloon tip into the uterus. The balloon tip is filled with heated fluid to remove the endometrium. The procedure may vary among health care providers and hospitals. What happens after the procedure?  Your blood pressure, heart rate, breathing rate, and blood oxygen level will be monitored until the medicines you were given have worn off.  As tissue healing occurs, you  may notice vaginal bleeding for 4-6 weeks after the procedure. You may also experience: ? Cramps. ? Thin, watery vaginal discharge that is light pink or brown in color. ? A need to urinate more frequently than usual. ? Nausea.  Do not drive for 24 hours if you were given a sedative.  Do not have sex or insert anything into your vagina until your health care provider approves. Summary  Endometrial ablation is done to treat the many causes of heavy menstrual bleeding.  The procedure may be done only after medications have been tried to control the bleeding.  Plan to have someone take you home from the hospital or clinic. This information is not intended to replace advice given to you by your health care provider. Make sure you discuss any questions you have with your health care provider. Document Released: 06/11/2004 Document Revised: 08/19/2016 Document Reviewed: 08/19/2016 Elsevier Interactive Patient Education  2017 [employer].

## 2017-03-25 NOTE — Progress Notes (Signed)
GYNECOLOGY OFFICE VISIT NOTE  History:  51 y.o. G3P3 here today for discussion of ultrasound results for workup of AUB. Also wants to discuss recent positive Trichomonas diagnosis. She has been treated, and her husband is yet to be treated and refuses to get tested. He travels out of town often for work but denies any sexual infidelity; patient also denies any infidelity on her part.  She does want repeat culture today and serum STI testing.  She denies any abnormal vaginal discharge, bleeding, pelvic pain or other concerns.   Past Medical History:  Diagnosis Date  . Anemia   . Hypertension     Past Surgical History:  Procedure Laterality Date  . CESAREAN SECTION     x3  . ENDOMETRIAL BIOPSY  2009   anemia  . SHOULDER SURGERY     left shoulder  . TUBAL LIGATION     x 9384yrs    The following portions of the patient's history were reviewed and updated as appropriate: allergies, current medications, past family history, past medical history, past social history, past surgical history and problem list.   Health Maintenance:  Normal pap and negative HRHPV on 03/02/2017. Mammogram scheduled on 03/30/2017.   Review of Systems:  Pertinent items noted in HPI and remainder of comprehensive ROS otherwise negative.   Objective:  Physical Exam BP (!) 147/96   Pulse 93   Ht 5\' 2"  (1.575 m)   Wt 164 lb (74.4 kg)   LMP 03/21/2017   BMI 30.00 kg/m  CONSTITUTIONAL: Well-developed, well-nourished female in no acute distress.  HENT:  Normocephalic, atraumatic. External right and left ear normal. Oropharynx is clear and moist EYES: Conjunctivae and EOM are normal. Pupils are equal, round, and reactive to light. No scleral icterus.  NECK: Normal range of motion, supple, no masses SKIN: Skin is warm and dry. No rash noted. Not diaphoretic. No erythema. No pallor. NEUROLOGIC: Alert and oriented to person, place, and time. Normal reflexes, muscle tone coordination. No cranial nerve deficit  noted. PSYCHIATRIC: Normal mood and affect. Normal behavior. Normal judgment and thought content. CARDIOVASCULAR: Normal heart rate noted RESPIRATORY: Effort and breath sounds normal, no problems with respiration noted ABDOMEN: Soft, no distention noted.   PELVIC: Deferred MUSCULOSKELETAL: Normal range of motion. No edema noted.  Labs and Imaging [place] Transvaginal Non-ob  Result Date: 03/09/2017 CLINICAL DATA:  Abnormal uterine bleeding for 1 month, perimenopausal, LMP January 2018 EXAM: TRANSABDOMINAL AND TRANSVAGINAL ULTRASOUND OF PELVIS TECHNIQUE: Both transabdominal and transvaginal ultrasound examinations of the pelvis were performed. Transabdominal technique was performed for global imaging of the pelvis including uterus, ovaries, adnexal regions, and pelvic cul-de-sac. It was necessary to proceed with endovaginal exam following the transabdominal exam to visualize the LEFT ovary. COMPARISON:  None FINDINGS: Uterus Measurements: 12.0 x 4.0 x 6.0 cm. Normal morphology without mass Endometrium Thickness: 6 mm. Hyperechoic nodule within the endometrium at the upper uterine segment measuring 12 x 11 x 25 mm question polyp/mass. Small amount of associated endometrial fluid. Right ovary Measurements: 2.8 x 2.5 x 3.2 cm. Dominant follicle 19 mm diameter without additional mass Left ovary Not visualized on either transabdominal or endovaginal imaging question obscured by bowel Other findings No free pelvic fluid or adnexal masses otherwise seen. IMPRESSION: Nonvisualization of LEFT ovary. Endometrial mass 12 x 11 x 25 mm question polyp versus tumor with small amount of associated endometrial fluid, recommendation below. Consider further evaluation with sonohysterogram for confirmation prior to hysteroscopy. Endometrial sampling should also be considered if patient  is at high risk for endometrial carcinoma. (Ref: Radiological Reasoning: Algorithmic Workup of Abnormal Vaginal Bleeding with Endovaginal  Sonography and Sonohysterography. AJR 2008; 161:W96-04) Electronically Signed   By: Ulyses Southward M.D.   On: 03/09/2017 17:23   US Pelvis Complete  Result Date: 03/09/2017 CLINICAL DATA:  Abnormal uterine bleeding for 1 month, perimenopausal, LMP January 2018 EXAM: TRANSABDOMINAL AND TRANSVAGINAL ULTRASOUND OF PELVIS TECHNIQUE: Both transabdominal and transvaginal ultrasound examinations of the pelvis were performed. Transabdominal technique was performed for global imaging of the pelvis including uterus, ovaries, adnexal regions, and pelvic cul-de-sac. It was necessary to proceed with endovaginal exam following the transabdominal exam to visualize the LEFT ovary. COMPARISON:  None FINDINGS: Uterus Measurements: 12.0 x 4.0 x 6.0 cm. Normal morphology without mass Endometrium Thickness: 6 mm. Hyperechoic nodule within the endometrium at the upper uterine segment measuring 12 x 11 x 25 mm question polyp/mass. Small amount of associated endometrial fluid. Right ovary Measurements: 2.8 x 2.5 x 3.2 cm. Dominant follicle 19 mm diameter without additional mass Left ovary Not visualized on either transabdominal or endovaginal imaging question obscured by bowel Other findings No free pelvic fluid or adnexal masses otherwise seen. IMPRESSION: Nonvisualization of LEFT ovary. Endometrial mass 12 x 11 x 25 mm question polyp versus tumor with small amount of associated endometrial fluid, recommendation below. Consider further evaluation with sonohysterogram for confirmation prior to hysteroscopy. Endometrial sampling should also be considered if patient is at high risk for endometrial carcinoma. (Ref: Radiological Reasoning: Algorithmic Workup of Abnormal Vaginal Bleeding with Endovaginal Sonography and Sonohysterography. AJR 2008; 540:[id number]) Electronically Signed   By: Ulyses Southward M.D.   On: 03/09/2017 17:23    Assessment & Plan:  1. Abnormal uterine bleeding (AUB) Ultrasound showed 2.5 cm polyp. She reports no further  bleeding after taking Megace, no longer on Megace for now.  Desires removal of her polyp, declines endometrial biopsy.  She will be scheduled for Hysteroscopic Polypectomy, Dilation and Curettage. The risks of surgery were discussed in detail with the patient including but not limited to: bleeding; infection; injury to uterus or other surrounding organs; need for additional procedures including laparoscopy and other postoperative or anesthesia complications.  Patient was told that the likelihood that her condition and symptoms will be treated effectively with this surgical management was very high; the postoperative expectations were also discussed in detail. The patient also understands the alternative treatment options which were discussed in full. All questions were answered.  She was told that she will be contacted by our surgical scheduler regarding the time and date of her surgery; routine preoperative instructions of having nothing to eat or drink after midnight on the day prior to surgery and also coming to the hospital 1.5 hours prior to her time of surgery were also emphasized.  She was told she may be called for a preoperative appointment about a week prior to surgery and will be given further preoperative instructions at that visit. Printed patient education handouts about the procedure were given to the patient to review at home.    2. Exposure to sexually transmitted disease (STD) 3. BV (bacterial vaginosis) 4. Trichomonal vaginitis - Cervicovaginal ancillary only done for TOC for recent BV and Trichomonas - HIV antibody - Hepatitis C antibody - Hepatitis B surface antigen - RPR She was given Rx for Flagyl for her husband (expedited partner treatment) and encouraged to tell him again to get tested for other STIs.  She was also told she should abstain from unprotected sexual activity  for seven days until after he has received appropriate treatment.    Routine preventative health maintenance  measures emphasized. Please refer to After Visit Summary for other counseling recommendations.   Return if symptoms worsen or fail to improve or other GYN concerns.   Total face-to-face time with patient: 25 minutes. Over 50% of encounter was spent on counseling and coordination of care.   Jaynie CollinsUGONNA  ANYANWU, MD, FACOG Attending Obstetrician & Gynecologist, [facility], [facility]

## 2017-03-29 LAB — RPR: RPR Ser Ql: NONREACTIVE

## 2017-03-29 LAB — HIV ANTIBODY (ROUTINE TESTING W REFLEX): HIV SCREEN 4TH GENERATION: NONREACTIVE

## 2017-03-29 LAB — HEPATITIS B SURFACE ANTIGEN: HEP B S AG: NEGATIVE

## 2017-03-29 LAB — HEPATITIS C ANTIBODY: HEP C VIRUS AB: 0.1 {s_co_ratio} (ref 0.0–0.9)

## 2017-03-30 LAB — CERVICOVAGINAL ANCILLARY ONLY
Bacterial vaginitis: NEGATIVE
CHLAMYDIA, DNA PROBE: NEGATIVE
Candida vaginitis: NEGATIVE
Neisseria Gonorrhea: NEGATIVE
Trichomonas: NEGATIVE

## 2017-04-06 NOTE — Patient Instructions (Addendum)
Your procedure is scheduled on:  Friday, Aug. 31, 2018  Enter through the [employer] [facility] at:  11:30 AM  Pick up the phone at the desk and dial [phone].  Call this number if you have problems the morning of surgery: [phone].  Remember: Do NOT eat food:  After Midnight Thursday  Do NOT drink clear liquids after:  7:00 AM Thursday  Take these medicines the morning of surgery with a SIP OF WATER:  Amlodipine, Nexium  Stop ALL herbal medications, Omega 3, Vitamin C at this time  Do NOT smoke the day of surgery.  Do NOT wear jewelry (body piercing), metal hair clips/bobby pins, make-up, artifical eyelashes or nail polish. Do NOT wear lotions, powders, or perfumes.  You may wear deodorant. Do NOT shave for 48 hours prior to surgery. Do NOT bring valuables to the hospital. Contacts, dentures, or bridgework may not be worn into surgery.  Have a responsible adult drive you home and stay with you for 24 hours after your procedure  Bring a copy of your healthcare power of attorney and living will documents.

## 2017-04-07 ENCOUNTER — Encounter (HOSPITAL_COMMUNITY): Payer: Self-pay

## 2017-04-07 ENCOUNTER — Encounter (HOSPITAL_COMMUNITY)
Admission: RE | Admit: 2017-04-07 | Discharge: 2017-04-07 | Disposition: A | Discharge status: Home / Self Care | Payer: BLUE CROSS/BLUE SHIELD | Source: Ambulatory Visit | Attending: Obstetrics & Gynecology | Admitting: Obstetrics & Gynecology

## 2017-04-07 ENCOUNTER — Other Ambulatory Visit: Payer: Self-pay

## 2017-04-07 DIAGNOSIS — N939 Abnormal uterine and vaginal bleeding, unspecified: Secondary | ICD-10-CM | POA: Diagnosis not present

## 2017-04-07 DIAGNOSIS — I10 Essential (primary) hypertension: Secondary | ICD-10-CM | POA: Insufficient documentation

## 2017-04-07 DIAGNOSIS — Z01812 Encounter for preprocedural laboratory examination: Secondary | ICD-10-CM | POA: Diagnosis not present

## 2017-04-07 DIAGNOSIS — Z01810 Encounter for preprocedural cardiovascular examination: Secondary | ICD-10-CM | POA: Insufficient documentation

## 2017-04-07 DIAGNOSIS — K219 Gastro-esophageal reflux disease without esophagitis: Secondary | ICD-10-CM | POA: Diagnosis not present

## 2017-04-07 DIAGNOSIS — A5901 Trichomonal vulvovaginitis: Secondary | ICD-10-CM | POA: Diagnosis not present

## 2017-04-07 DIAGNOSIS — D649 Anemia, unspecified: Secondary | ICD-10-CM | POA: Insufficient documentation

## 2017-04-07 HISTORY — DX: Adverse effect of unspecified anesthetic, initial encounter: T41.45XA

## 2017-04-07 HISTORY — DX: Gastro-esophageal reflux disease without esophagitis: K21.9

## 2017-04-07 HISTORY — DX: Prediabetes: R73.03

## 2017-04-07 HISTORY — DX: Other complications of anesthesia, initial encounter: T88.59XA

## 2017-04-07 HISTORY — DX: Unspecified osteoarthritis, unspecified site: M19.90

## 2017-04-07 LAB — CBC
HEMATOCRIT: 30.2 % — AB (ref 36.0–46.0)
Hemoglobin: 9.5 g/dL — ABNORMAL LOW (ref 12.0–15.0)
MCH: 24.2 pg — AB (ref 26.0–34.0)
MCHC: 31.5 g/dL (ref 30.0–36.0)
MCV: 77 fL — AB (ref 78.0–100.0)
PLATELETS: 281 10*3/uL (ref 150–400)
RBC: 3.92 MIL/uL (ref 3.87–5.11)
RDW: 15.8 % — AB (ref 11.5–15.5)
WBC: 10.1 10*3/uL (ref 4.0–10.5)

## 2017-04-07 LAB — BASIC METABOLIC PANEL
Anion gap: 7 (ref 5–15)
BUN: 15 mg/dL (ref 6–20)
CALCIUM: 9.3 mg/dL (ref 8.9–10.3)
CO2: 22 mmol/L (ref 22–32)
CREATININE: 0.61 mg/dL (ref 0.44–1.00)
Chloride: 108 mmol/L (ref 101–111)
GFR calc Af Amer: >60 mL/min (ref >60)
GLUCOSE: 100 mg/dL — AB (ref 65–99)
Potassium: 3.5 mmol/L (ref 3.5–5.1)
Sodium: 137 mmol/L (ref 135–145)

## 2017-04-14 NOTE — Anesthesia Preprocedure Evaluation (Signed)
Anesthesia Evaluation  Patient identified by MRN, date of birth, ID band Patient awake    Reviewed: Allergy & Precautions, NPO status , Patient's Chart, lab work & pertinent test results  Airway Mallampati: II  TM Distance: >3 FB Neck ROM: Full    Dental no notable dental hx.    Pulmonary neg pulmonary ROS, Current Smoker,    Pulmonary exam normal breath sounds clear to auscultation       Cardiovascular hypertension, negative cardio ROS Normal cardiovascular exam Rhythm:Regular Rate:Normal     Neuro/Psych negative neurological ROS  negative psych ROS   GI/Hepatic negative GI ROS, Neg liver ROS, GERD  ,  Endo/Other  negative endocrine ROS  Renal/GU negative Renal ROS  negative genitourinary   Musculoskeletal negative musculoskeletal ROS (+) Arthritis ,   Abdominal   Peds negative pediatric ROS (+)  Hematology negative hematology ROS (+) anemia ,   Anesthesia Other Findings   Reproductive/Obstetrics negative OB ROS                             Anesthesia Physical Anesthesia Plan  ASA: II  Anesthesia Plan: General   Post-op Pain Management:    Induction: Intravenous  PONV Risk Score and Plan: 3 and Ondansetron, Dexamethasone, Midazolam, Treatment may vary due to age or medical condition and Scopolamine patch - Pre-op  Airway Management Planned: LMA  Additional Equipment:   Intra-op Plan:   Post-operative Plan:   Informed Consent:   Plan Discussed with: CRNA and Surgeon  Anesthesia Plan Comments: ( )        Anesthesia Quick Evaluation

## 2017-04-15 ENCOUNTER — Ambulatory Visit (HOSPITAL_COMMUNITY): Payer: BLUE CROSS/BLUE SHIELD | Admitting: Anesthesiology

## 2017-04-15 ENCOUNTER — Ambulatory Visit (HOSPITAL_COMMUNITY)
Admission: RE | Admit: 2017-04-15 | Discharge: 2017-04-15 | Disposition: A | Discharge status: Home / Self Care | Payer: BLUE CROSS/BLUE SHIELD | Source: Ambulatory Visit | Attending: Obstetrics & Gynecology | Admitting: Obstetrics & Gynecology

## 2017-04-15 ENCOUNTER — Encounter (HOSPITAL_COMMUNITY): Payer: Self-pay

## 2017-04-15 ENCOUNTER — Encounter (HOSPITAL_COMMUNITY)
Admission: RE | Disposition: A | Discharge status: Home / Self Care | Payer: Self-pay | Source: Ambulatory Visit | Attending: Obstetrics & Gynecology

## 2017-04-15 ENCOUNTER — Encounter: Payer: Self-pay | Admitting: Obstetrics & Gynecology

## 2017-04-15 DIAGNOSIS — N840 Polyp of corpus uteri: Secondary | ICD-10-CM | POA: Diagnosis not present

## 2017-04-15 DIAGNOSIS — N939 Abnormal uterine and vaginal bleeding, unspecified: Secondary | ICD-10-CM | POA: Diagnosis present

## 2017-04-15 DIAGNOSIS — I10 Essential (primary) hypertension: Secondary | ICD-10-CM | POA: Insufficient documentation

## 2017-04-15 DIAGNOSIS — M1990 Unspecified osteoarthritis, unspecified site: Secondary | ICD-10-CM | POA: Diagnosis not present

## 2017-04-15 DIAGNOSIS — Z79899 Other long term (current) drug therapy: Secondary | ICD-10-CM | POA: Diagnosis not present

## 2017-04-15 DIAGNOSIS — F17210 Nicotine dependence, cigarettes, uncomplicated: Secondary | ICD-10-CM | POA: Insufficient documentation

## 2017-04-15 DIAGNOSIS — K219 Gastro-esophageal reflux disease without esophagitis: Secondary | ICD-10-CM | POA: Insufficient documentation

## 2017-04-15 HISTORY — PX: HYSTEROSCOPY WITH D & C: SHX1775

## 2017-04-15 LAB — PREGNANCY, URINE: Preg Test, Ur: NEGATIVE

## 2017-04-15 SURGERY — DILATATION AND CURETTAGE /HYSTEROSCOPY
Anesthesia: General | Site: Vagina

## 2017-04-15 MED ORDER — ONDANSETRON HCL 4 MG/2ML IJ SOLN
INTRAMUSCULAR | Status: AC
  Filled 2017-04-15: qty 2

## 2017-04-15 MED ORDER — BUPIVACAINE HCL (PF) 0.5 % IJ SOLN
INTRAMUSCULAR | Status: AC
  Filled 2017-04-15: qty 30

## 2017-04-15 MED ORDER — KETOROLAC TROMETHAMINE 30 MG/ML IJ SOLN
INTRAMUSCULAR | Status: DC | PRN
  Administered 2017-04-15: 30 mg via INTRAVENOUS

## 2017-04-15 MED ORDER — DEXAMETHASONE SODIUM PHOSPHATE 10 MG/ML IJ SOLN
INTRAMUSCULAR | Status: DC | PRN
  Administered 2017-04-15: 4 mg via INTRAVENOUS

## 2017-04-15 MED ORDER — PROPOFOL 10 MG/ML IV BOLUS
INTRAVENOUS | Status: DC | PRN
  Administered 2017-04-15: 150 mg via INTRAVENOUS

## 2017-04-15 MED ORDER — FENTANYL CITRATE (PF) 100 MCG/2ML IJ SOLN: 25.0000 ug | INTRAMUSCULAR | Status: DC | PRN

## 2017-04-15 MED ORDER — LACTATED RINGERS IV SOLN
INTRAVENOUS | Status: DC
  Administered 2017-04-15: 125 mL/h via INTRAVENOUS

## 2017-04-15 MED ORDER — ONDANSETRON HCL 4 MG/2ML IJ SOLN
INTRAMUSCULAR | Status: DC | PRN
  Administered 2017-04-15: 4 mg via INTRAVENOUS

## 2017-04-15 MED ORDER — FENTANYL CITRATE (PF) 100 MCG/2ML IJ SOLN
INTRAMUSCULAR | Status: DC | PRN
  Administered 2017-04-15 (×2): 50 ug via INTRAVENOUS

## 2017-04-15 MED ORDER — LIDOCAINE HCL (CARDIAC) 20 MG/ML IV SOLN
INTRAVENOUS | Status: AC
  Filled 2017-04-15: qty 5

## 2017-04-15 MED ORDER — MEPERIDINE HCL 25 MG/ML IJ SOLN: 6.2500 mg | INTRAMUSCULAR | Status: DC | PRN

## 2017-04-15 MED ORDER — IBUPROFEN 600 MG PO TABS: 600.0000 mg | ORAL_TABLET | Freq: Four times a day (QID) | ORAL | 3 refills | Status: AC | PRN

## 2017-04-15 MED ORDER — DOCUSATE SODIUM 100 MG PO CAPS: 100.0000 mg | ORAL_CAPSULE | Freq: Two times a day (BID) | ORAL | 2 refills | Status: AC | PRN

## 2017-04-15 MED ORDER — BUPIVACAINE HCL 0.5 % IJ SOLN
INTRAMUSCULAR | Status: DC | PRN
  Administered 2017-04-15: 30 mL

## 2017-04-15 MED ORDER — OXYCODONE-ACETAMINOPHEN 5-325 MG PO TABS: 1.0000 | ORAL_TABLET | Freq: Four times a day (QID) | ORAL | 0 refills | Status: AC | PRN

## 2017-04-15 MED ORDER — SCOPOLAMINE 1 MG/3DAYS TD PT72
1.0000 | MEDICATED_PATCH | Freq: Once | TRANSDERMAL | Status: DC
  Administered 2017-04-15: 1.5 mg via TRANSDERMAL

## 2017-04-15 MED ORDER — DEXAMETHASONE SODIUM PHOSPHATE 4 MG/ML IJ SOLN
INTRAMUSCULAR | Status: AC
  Filled 2017-04-15: qty 1

## 2017-04-15 MED ORDER — SODIUM CHLORIDE 0.9 % IR SOLN
Status: DC | PRN
  Administered 2017-04-15: 3000 mL

## 2017-04-15 MED ORDER — KETOROLAC TROMETHAMINE 30 MG/ML IJ SOLN
INTRAMUSCULAR | Status: AC
  Filled 2017-04-15: qty 1

## 2017-04-15 MED ORDER — SCOPOLAMINE 1 MG/3DAYS TD PT72
MEDICATED_PATCH | TRANSDERMAL | Status: AC
  Administered 2017-04-15: 1.5 mg via TRANSDERMAL
  Filled 2017-04-15: qty 1

## 2017-04-15 MED ORDER — MIDAZOLAM HCL 2 MG/2ML IJ SOLN
INTRAMUSCULAR | Status: DC | PRN
  Administered 2017-04-15: 2 mg via INTRAVENOUS

## 2017-04-15 MED ORDER — LACTATED RINGERS IV SOLN
INTRAVENOUS | Status: DC
  Administered 2017-04-15: 13:00:00 via INTRAVENOUS

## 2017-04-15 MED ORDER — PROPOFOL 10 MG/ML IV BOLUS
INTRAVENOUS | Status: AC
  Filled 2017-04-15: qty 20

## 2017-04-15 MED ORDER — FENTANYL CITRATE (PF) 250 MCG/5ML IJ SOLN
INTRAMUSCULAR | Status: AC
  Filled 2017-04-15: qty 5

## 2017-04-15 MED ORDER — LIDOCAINE HCL (CARDIAC) 20 MG/ML IV SOLN
INTRAVENOUS | Status: DC | PRN
  Administered 2017-04-15: 60 mg via INTRAVENOUS

## 2017-04-15 MED ORDER — ONDANSETRON HCL 4 MG/2ML IJ SOLN: 4.0000 mg | Freq: Once | INTRAMUSCULAR | Status: DC | PRN

## 2017-04-15 MED ORDER — MIDAZOLAM HCL 2 MG/2ML IJ SOLN
INTRAMUSCULAR | Status: AC
  Filled 2017-04-15: qty 2

## 2017-04-15 SURGICAL SUPPLY — 15 items
BIPOLAR CUTTING LOOP 21FR (ELECTRODE)
CANISTER SUCT 3000ML PPV (MISCELLANEOUS) ×4 IMPLANT
CATH ROBINSON RED A/P 16FR (CATHETERS) ×4 IMPLANT
CLOTH BEACON ORANGE TIMEOUT ST (SAFETY) ×4 IMPLANT
CONTAINER PREFILL 10% NBF 60ML (FORM) ×8 IMPLANT
GLOVE BIOGEL PI IND STRL 7.0 (GLOVE) ×2 IMPLANT
GLOVE BIOGEL PI INDICATOR 7.0 (GLOVE) ×2
GLOVE ECLIPSE 7.0 STRL STRAW (GLOVE) ×4 IMPLANT
GOWN STRL REUS W/TWL LRG LVL3 (GOWN DISPOSABLE) ×8 IMPLANT
LOOP CUTTING BIPOLAR 21FR (ELECTRODE) IMPLANT
PACK VAGINAL MINOR WOMEN LF (CUSTOM PROCEDURE TRAY) ×4 IMPLANT
PAD OB MATERNITY 4.3X12.25 (PERSONAL CARE ITEMS) ×4 IMPLANT
TOWEL OR 17X24 6PK STRL BLUE (TOWEL DISPOSABLE) ×8 IMPLANT
TUBING AQUILEX INFLOW (TUBING) ×4 IMPLANT
TUBING AQUILEX OUTFLOW (TUBING) ×4 IMPLANT

## 2017-04-15 NOTE — Interval H&P Note (Signed)
History and Physical Interval Note 04/15/2017 11:55 AM  Megan Pollard  has presented today for surgery, with the diagnosis of Endometrial Polyp, AUB.  The various methods of treatment have been discussed with the patient and family. After consideration of risks, benefits and other options for treatment, the patient has consented to Procedure(s): DILATATION AND CURETTAGE /HYSTEROSCOPY, CERVICAL POLYPECTOMY as a surgical intervention.  The patient's history has been reviewed, patient examined, no change in status, stable for surgery.  I have reviewed the patient's chart and labs.  Questions were answered to the patient's satisfaction.  To OR when ready.   Jaynie CollinsUGONNA  Jenan Ellegood, MD, FACOG Attending Obstetrician & Gynecologist, [facility], [facility]

## 2017-04-15 NOTE — Transfer of Care (Signed)
Immediate Anesthesia Transfer of Care Note  Patient: Megan Pollard  Procedure(s) Performed: Procedure(s): DILATATION AND CURETTAGE /HYSTEROSCOPY AND ENDOMETRIAL POLYPECTOMY (N/A)  Patient Location: PACU  Anesthesia Type:General  Level of Consciousness: awake, alert  and oriented  Airway & Oxygen Therapy: Patient Spontanous Breathing and Patient connected to nasal cannula oxygen  Post-op Assessment: Report given to RN and Post -op Vital signs reviewed and stable  Post vital signs: Reviewed and stable SaO2 100%, HR 75, RR 13, BP 141/94  Last Vitals:  Vitals:   04/15/17 1131  BP: 137/88  Pulse: 85  Resp: 20  Temp: 36.6 C  SpO2: 100%    Last Pain:  Vitals:   04/15/17 1131  TempSrc: Oral  PainSc: 0-No pain      Patients Stated Pain Goal: 3 (04/15/17 1131)  Complications: No apparent anesthesia complications

## 2017-04-15 NOTE — Discharge Instructions (Signed)
°Post Anesthesia Home Care Instructions ° °Activity: °Get plenty of rest for the remainder of the day. A responsible individual must stay with you for 24 hours following the procedure.  °For the next 24 hours, DO NOT: °-Drive a car °-Operate machinery °-Drink alcoholic beverages °-Take any medication unless instructed by your physician °-Make any legal decisions or sign important papers. ° °Meals: °Start with liquid foods such as gelatin or soup. Progress to regular foods as tolerated. Avoid greasy, spicy, heavy foods. If nausea and/or vomiting occur, drink only clear liquids until the nausea and/or vomiting subsides. Call your physician if vomiting continues. ° °Special Instructions/Symptoms: °Your throat may feel dry or sore from the anesthesia or the breathing tube placed in your throat during surgery. If this causes discomfort, gargle with warm salt water. The discomfort should disappear within 24 hours. ° °If you had a scopolamine patch placed behind your ear for the management of post- operative nausea and/or vomiting: ° °1. The medication in the patch is effective for 72 hours, after which it should be removed.  Wrap patch in a tissue and discard in the trash. Wash hands thoroughly with soap and water. °2. You may remove the patch earlier than 72 hours if you experience unpleasant side effects which may include dry mouth, dizziness or visual disturbances. °3. Avoid touching the patch. Wash your hands with soap and water after contact with the patch. °  °Hysteroscopy, Care After °Refer to this sheet in the next few weeks. These instructions provide you with information on caring for yourself after your procedure. Your health care provider may also give you more specific instructions. Your treatment has been planned according to current medical practices, but problems sometimes occur. Call your health care provider if you have any problems or questions after your procedure. °What can I expect after the  procedure? °After your procedure, it is typical to have the following: °· You may have some cramping. This normally lasts for a couple days. °· You may have bleeding. This can vary from light spotting for a few days to menstrual-like bleeding for 3-7 days. ° °Follow these instructions at home: °· Rest for the first 1-2 days after the procedure. °· Only take over-the-counter or prescription medicines as directed by your health care provider. Do not take aspirin. It can increase the chances of bleeding. °· Take showers instead of baths for 2 weeks or as directed by your health care provider. °· Do not drive for 24 hours or as directed. °· Do not drink alcohol while taking pain medicine. °· Do not use tampons, douche, or have sexual intercourse for 2 weeks or until your health care provider says it is okay. °· Take your temperature twice a day for 4-5 days. Write it down each time. °· Follow your health care provider's advice about diet, exercise, and lifting. °· If you develop constipation, you may: °? Take a mild laxative if your health care provider approves. °? Add bran foods to your diet. °? Drink enough fluids to keep your urine clear or pale yellow. °· Try to have someone with you or available to you for the first 24-48 hours, especially if you were given a general anesthetic. °· Follow up with your health care provider as directed. °Contact a health care provider if: °· You feel dizzy or lightheaded. °· You feel sick to your stomach (nauseous). °· You have abnormal vaginal discharge. °· You have a rash. °· You have pain that is not controlled with medicine. °  Get help right away if: °· You have bleeding that is heavier than a normal menstrual period. °· You have a fever. °· You have increasing cramps or pain, not controlled with medicine. °· You have new belly (abdominal) pain. °· You pass out. °· You have pain in the tops of your shoulders (shoulder strap areas). °· You have shortness of breath. °This  information is not intended to replace advice given to you by your health care provider. Make sure you discuss any questions you have with your health care provider. °Document Released: 05/23/2013 Document Revised: 01/08/2016 Document Reviewed: 03/01/2013 °Elsevier Interactive Patient Education © 2017 Else[employer] ° °

## 2017-04-15 NOTE — Anesthesia Procedure Notes (Addendum)
Procedure Name: LMA Insertion Date/Time: 04/15/2017 12:53 PM Performed by: Elbert EwingsHYMER, Aicia Babinski S Pre-anesthesia Checklist: Patient identified, Suction available, Emergency Drugs available, Patient being monitored and Timeout performed Patient Re-evaluated:Patient Re-evaluated prior to induction Oxygen Delivery Method: Circle system utilized Preoxygenation: Pre-oxygenation with 100% oxygen Induction Type: IV induction Ventilation: Mask ventilation without difficulty LMA: LMA inserted LMA Size: 3.0 Placement Confirmation: ETT inserted through vocal cords under direct vision,  positive ETCO2,  CO2 detector and breath sounds checked- equal and bilateral Dental Injury: Teeth and Oropharynx as per pre-operative assessment

## 2017-04-15 NOTE — Anesthesia Postprocedure Evaluation (Signed)
Anesthesia Post Note  Patient: Megan Pollard  Procedure(s) Performed: Procedure(s) (LRB): DILATATION AND CURETTAGE /HYSTEROSCOPY AND ENDOMETRIAL POLYPECTOMY (N/A)     Patient location during evaluation: PACU Anesthesia Type: General Level of consciousness: awake and alert Pain management: pain level controlled Vital Signs Assessment: post-procedure vital signs reviewed and stable Respiratory status: spontaneous breathing, nonlabored ventilation, respiratory function stable and patient connected to nasal cannula oxygen Cardiovascular status: blood pressure returned to baseline and stable Postop Assessment: no signs of nausea or vomiting Anesthetic complications: no    Last Vitals:  Vitals:   04/15/17 1400 04/15/17 1415  BP: 130/82 123/80  Pulse: 78 72  Resp: 17 12  Temp:    SpO2: 100% 100%    Last Pain:  Vitals:   04/15/17 1343  TempSrc:   PainSc: 1    Pain Goal: Patients Stated Pain Goal: 3 (04/15/17 1415)               [name]

## 2017-04-15 NOTE — Anesthesia Procedure Notes (Signed)
Procedure Name: LMA Insertion Date/Time: 04/15/2017 12:49 PM Performed by: Elbert EwingsHYMER, Hartley Urton S Pre-anesthesia Checklist: Patient identified, Emergency Drugs available, Suction available and Patient being monitored Patient Re-evaluated:Patient Re-evaluated prior to induction Oxygen Delivery Method: Circle system utilized Preoxygenation: Pre-oxygenation with 100% oxygen Induction Type: IV induction LMA: LMA inserted LMA Size: 3.0 Placement Confirmation: ETT inserted through vocal cords under direct vision,  positive ETCO2,  CO2 detector and breath sounds checked- equal and bilateral Dental Injury: Teeth and Oropharynx as per pre-operative assessment

## 2017-04-15 NOTE — Op Note (Signed)
PREOPERATIVE DIAGNOSIS:  Abnormal uterine bleeding, endometrial polyp POSTOPERATIVE DIAGNOSIS: The same PROCEDURE: Hysteroscopy, Dilation and Curettage, Polypectomy SURGEON:  Dr. Jaynie CollinsUgonna Baruc Tugwell   INDICATIONS: 51 y.o. G3P3  here for scheduled surgery for the aforementioned diagnoses.   Risks of surgery were discussed with the patient including but not limited to: bleeding which may require transfusion; infection which may require antibiotics; injury to uterus or surrounding organs; intrauterine scarring which may impair future fertility; need for additional procedures including laparotomy or laparoscopy; and other postoperative/anesthesia complications. Written informed consent was obtained.    FINDINGS:  A 12 week size uterus.  Small polypoid masses noted near the top of the fundus, one in posterior endometrium and another in the right endometrium cavity.  Diffuse proliferative endometrium.  Normal ostia bilaterally.  ANESTHESIA:   General, paracervical block with 30 ml of 0.5% Marcaine INTRAVENOUS FLUIDS:  800 ml of LR FLUID DEFICITS:  200 ml of NS ESTIMATED BLOOD LOSS:  20 ml SPECIMENS: Endometrial curettings sent to pathology COMPLICATIONS:  None immediate.  PROCEDURE DETAILS:  The patient received intravenous antibiotics while in the preoperative area.  She was then taken to the operating room where general anesthesia was administered and was found to be adequate.  After an adequate timeout was performed, she was placed in the dorsal lithotomy position and examined; then prepped and draped in the sterile manner.   Her bladder was catheterized for an unmeasured amount of clear, yellow urine. A speculum was then placed in the patient's vagina and a single tooth tenaculum was applied to the anterior lip of the cervix.   A paracervical block using 30 ml of 0.5% Marcaine was administered.  The cervix was sounded to 12 cm and dilated manually with metal dilators to accommodate the 5 mm diagnostic  hysteroscope.  Once the cervix was dilated, the hysteroscope was inserted under direct visualization using glycine as a suspension medium.  The uterine cavity was carefully examined with the findings as noted above.   After further careful visualization of the uterine cavity, the hysteroscope was removed under direct visualization.  A sharp curettage was then performed to obtain a moderate amount of endometrial curettings and the polyps were removed during curettage as noted on hysteroscopy done post-curettage.  The tenaculum was removed from the anterior lip of the cervix and the vaginal speculum was removed after noting good hemostasis.  The patient tolerated the procedure well and was taken to the recovery area awake, extubated and in stable condition.  The patient will be discharged to home as per PACU criteria.  Routine postoperative instructions given.  She was prescribed Percocet, Ibuprofen and Colace.  She will follow up in the clinic on 05/06/2017  for postoperative evaluation.    Jaynie CollinsUGONNA  Durante Violett, MD, FACOG Attending Obstetrician & Gynecologist, [facility], [facility]

## 2017-04-15 NOTE — H&P (View-Only) (Signed)
 GYNECOLOGY OFFICE VISIT NOTE  History:  51 y.o. G3P3 here today for discussion of ultrasound results for workup of AUB. Also wants to discuss recent positive Trichomonas diagnosis. She has been treated, and her husband is yet to be treated and refuses to get tested. He travels out of town often for work but denies any sexual infidelity; patient also denies any infidelity on her part.  She does want repeat culture today and serum STI testing.  She denies any abnormal vaginal discharge, bleeding, pelvic pain or other concerns.   Past Medical History:  Diagnosis Date  . Anemia   . Hypertension     Past Surgical History:  Procedure Laterality Date  . CESAREAN SECTION     x3  . ENDOMETRIAL BIOPSY  2009   anemia  . SHOULDER SURGERY     left shoulder  . TUBAL LIGATION     x 20yrs    The following portions of the patient's history were reviewed and updated as appropriate: allergies, current medications, past family history, past medical history, past social history, past surgical history and problem list.   Health Maintenance:  Normal pap and negative HRHPV on 03/02/2017. Mammogram scheduled on 03/30/2017.   Review of Systems:  Pertinent items noted in HPI and remainder of comprehensive ROS otherwise negative.   Objective:  Physical Exam BP (!) 147/96   Pulse 93   Ht 5' 2" (1.575 m)   Wt 164 lb (74.4 kg)   LMP 03/21/2017   BMI 30.00 kg/m  CONSTITUTIONAL: Well-developed, well-nourished female in no acute distress.  HENT:  Normocephalic, atraumatic. External right and left ear normal. Oropharynx is clear and moist EYES: Conjunctivae and EOM are normal. Pupils are equal, round, and reactive to light. No scleral icterus.  NECK: Normal range of motion, supple, no masses SKIN: Skin is warm and dry. No rash noted. Not diaphoretic. No erythema. No pallor. NEUROLOGIC: Alert and oriented to person, place, and time. Normal reflexes, muscle tone coordination. No cranial nerve deficit  noted. PSYCHIATRIC: Normal mood and affect. Normal behavior. Normal judgment and thought content. CARDIOVASCULAR: Normal heart rate noted RESPIRATORY: Effort and breath sounds normal, no problems with respiration noted ABDOMEN: Soft, no distention noted.   PELVIC: Deferred MUSCULOSKELETAL: Normal range of motion. No edema noted.  Labs and Imaging Us Transvaginal Non-ob  Result Date: 03/09/2017 CLINICAL DATA:  Abnormal uterine bleeding for 1 month, perimenopausal, LMP January 2018 EXAM: TRANSABDOMINAL AND TRANSVAGINAL ULTRASOUND OF PELVIS TECHNIQUE: Both transabdominal and transvaginal ultrasound examinations of the pelvis were performed. Transabdominal technique was performed for global imaging of the pelvis including uterus, ovaries, adnexal regions, and pelvic cul-de-sac. It was necessary to proceed with endovaginal exam following the transabdominal exam to visualize the LEFT ovary. COMPARISON:  None FINDINGS: Uterus Measurements: 12.0 x 4.0 x 6.0 cm. Normal morphology without mass Endometrium Thickness: 6 mm. Hyperechoic nodule within the endometrium at the upper uterine segment measuring 12 x 11 x 25 mm question polyp/mass. Small amount of associated endometrial fluid. Right ovary Measurements: 2.8 x 2.5 x 3.2 cm. Dominant follicle 19 mm diameter without additional mass Left ovary Not visualized on either transabdominal or endovaginal imaging question obscured by bowel Other findings No free pelvic fluid or adnexal masses otherwise seen. IMPRESSION: Nonvisualization of LEFT ovary. Endometrial mass 12 x 11 x 25 mm question polyp versus tumor with small amount of associated endometrial fluid, recommendation below. Consider further evaluation with sonohysterogram for confirmation prior to hysteroscopy. Endometrial sampling should also be considered if patient   is at high risk for endometrial carcinoma. (Ref: Radiological Reasoning: Algorithmic Workup of Abnormal Vaginal Bleeding with Endovaginal  Sonography and Sonohysterography. AJR 2008; 191:S68-73) Electronically Signed   By: Mark  Boles M.D.   On: 03/09/2017 17:23   Us Pelvis Complete  Result Date: 03/09/2017 CLINICAL DATA:  Abnormal uterine bleeding for 1 month, perimenopausal, LMP January 2018 EXAM: TRANSABDOMINAL AND TRANSVAGINAL ULTRASOUND OF PELVIS TECHNIQUE: Both transabdominal and transvaginal ultrasound examinations of the pelvis were performed. Transabdominal technique was performed for global imaging of the pelvis including uterus, ovaries, adnexal regions, and pelvic cul-de-sac. It was necessary to proceed with endovaginal exam following the transabdominal exam to visualize the LEFT ovary. COMPARISON:  None FINDINGS: Uterus Measurements: 12.0 x 4.0 x 6.0 cm. Normal morphology without mass Endometrium Thickness: 6 mm. Hyperechoic nodule within the endometrium at the upper uterine segment measuring 12 x 11 x 25 mm question polyp/mass. Small amount of associated endometrial fluid. Right ovary Measurements: 2.8 x 2.5 x 3.2 cm. Dominant follicle 19 mm diameter without additional mass Left ovary Not visualized on either transabdominal or endovaginal imaging question obscured by bowel Other findings No free pelvic fluid or adnexal masses otherwise seen. IMPRESSION: Nonvisualization of LEFT ovary. Endometrial mass 12 x 11 x 25 mm question polyp versus tumor with small amount of associated endometrial fluid, recommendation below. Consider further evaluation with sonohysterogram for confirmation prior to hysteroscopy. Endometrial sampling should also be considered if patient is at high risk for endometrial carcinoma. (Ref: Radiological Reasoning: Algorithmic Workup of Abnormal Vaginal Bleeding with Endovaginal Sonography and Sonohysterography. AJR 2008; 191:S68-73) Electronically Signed   By: Mark  Boles M.D.   On: 03/09/2017 17:23    Assessment & Plan:  1. Abnormal uterine bleeding (AUB) Ultrasound showed 2.5 cm polyp. She reports no further  bleeding after taking Megace, no longer on Megace for now.  Desires removal of her polyp, declines endometrial biopsy.  She will be scheduled for Hysteroscopic Polypectomy, Dilation and Curettage. The risks of surgery were discussed in detail with the patient including but not limited to: bleeding; infection; injury to uterus or other surrounding organs; need for additional procedures including laparoscopy and other postoperative or anesthesia complications.  Patient was told that the likelihood that her condition and symptoms will be treated effectively with this surgical management was very high; the postoperative expectations were also discussed in detail. The patient also understands the alternative treatment options which were discussed in full. All questions were answered.  She was told that she will be contacted by our surgical scheduler regarding the time and date of her surgery; routine preoperative instructions of having nothing to eat or drink after midnight on the day prior to surgery and also coming to the hospital 1.5 hours prior to her time of surgery were also emphasized.  She was told she may be called for a preoperative appointment about a week prior to surgery and will be given further preoperative instructions at that visit. Printed patient education handouts about the procedure were given to the patient to review at home.    2. Exposure to sexually transmitted disease (STD) 3. BV (bacterial vaginosis) 4. Trichomonal vaginitis - Cervicovaginal ancillary only done for TOC for recent BV and Trichomonas - HIV antibody - Hepatitis C antibody - Hepatitis B surface antigen - RPR She was given Rx for Flagyl for her husband (expedited partner treatment) and encouraged to tell him again to get tested for other STIs.  She was also told she should abstain from unprotected sexual activity   for seven days until after he has received appropriate treatment.    Routine preventative health maintenance  measures emphasized. Please refer to After Visit Summary for other counseling recommendations.   Return if symptoms worsen or fail to improve or other GYN concerns.   Total face-to-face time with patient: 25 minutes. Over 50% of encounter was spent on counseling and coordination of care.   Ondre Salvetti, MD, FACOG Attending Obstetrician & Gynecologist, [facility],  [facility]  

## 2017-04-16 ENCOUNTER — Encounter (HOSPITAL_COMMUNITY): Payer: Self-pay | Admitting: Obstetrics & Gynecology

## 2017-05-06 ENCOUNTER — Ambulatory Visit (INDEPENDENT_AMBULATORY_CARE_PROVIDER_SITE_OTHER): Payer: BLUE CROSS/BLUE SHIELD | Admitting: Obstetrics & Gynecology

## 2017-05-06 VITALS — BP 137/89 | HR 91 | Wt 167.0 lb

## 2017-05-06 DIAGNOSIS — Z09 Encounter for follow-up examination after completed treatment for conditions other than malignant neoplasm: Secondary | ICD-10-CM

## 2017-05-06 NOTE — Patient Instructions (Signed)
Return to clinic for any scheduled appointments or for any gynecologic concerns as needed.   

## 2017-05-06 NOTE — Progress Notes (Signed)
Subjective:     Megan Pollard is a 51 y.o. G3P3 female who presents to the clinic 3 weeks status post operative hysteroscopy, polypectomy for abnormal uterine bleeding and polyp. Eating a regular diet without difficulty. Bowel movements are normal. The patient is not having any pain.  The following portions of the patient's history were reviewed and updated as appropriate: allergies, current medications, past family history, past medical history, past social history, past surgical history and problem list.  Normal pap 03/02/17, in the process of scheduling.  Review of Systems Pertinent items noted in HPI and remainder of comprehensive ROS otherwise negative.    Objective:    BP 137/89   Pulse 91   Wt 167 lb (75.8 kg)   BMI 29.58 kg/m  General:  alert and no distress  Abdomen: soft, bowel sounds active, non-tender  Pelvic:   deferred    04/15/17 Surgical Pathology Diagnosis Endometrium, curettage - INACTIVE ENDOMETRIUM WITH PROGESTATIONAL CHANGES. - BENIGN ENDOMETRIAL POLYP. - NO HYPERPLASIA OR MALIGNANCY.  Assessment:    Doing well postoperatively. Operative findings again reviewed. Pathology report discussed.    Plan:   1. Continue any current medications; will continue Megace for AUB as needed 2. Activity restrictions: none 3. Anticipated return to work: not applicable. 4. Follow up as needed   Jaynie Collins, MD, FACOG Attending Obstetrician & Gynecologist, [facility] [employer], [facility]

## 2017-10-07 IMAGING — US US TRANSVAGINAL NON-OB
1 series · 13 of 25 positions shown · non-contrast
Comparison: None

CLINICAL DATA: Abnormal uterine bleeding for 1 month,
perimenopausal, LMP August 2016

EXAM:
TRANSABDOMINAL AND TRANSVAGINAL ULTRASOUND OF PELVIS
TECHNIQUE: Both transabdominal and transvaginal ultrasound examinations of the
pelvis were performed. Transabdominal technique was performed for
global imaging of the pelvis including uterus, ovaries, adnexal
regions, and pelvic cul-de-sac. It was necessary to proceed with
endovaginal exam following the transabdominal exam to visualize the
LEFT ovary.

[Series 1: us transvaginal non-ob · 0.23mm/px · 13 of 90 slices shown]
[im 1/90]
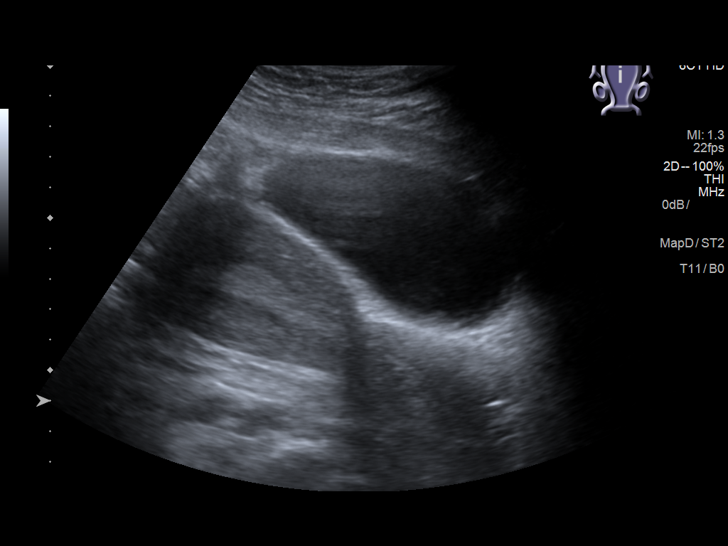
[im 8/90]
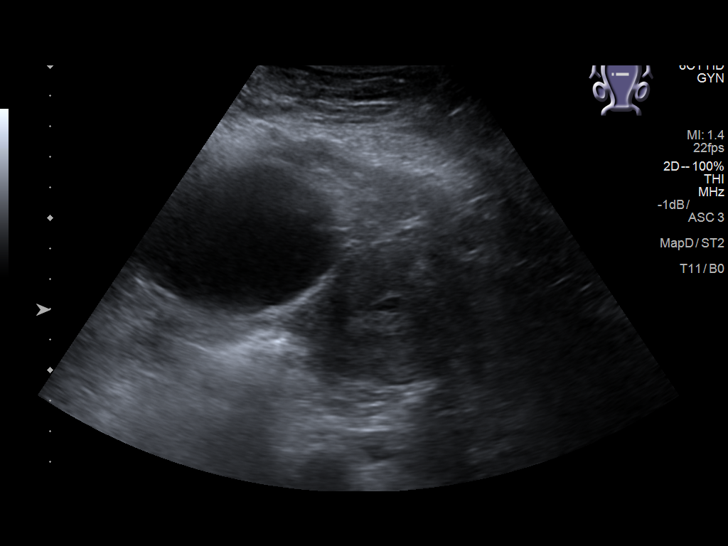
[im 15/90]
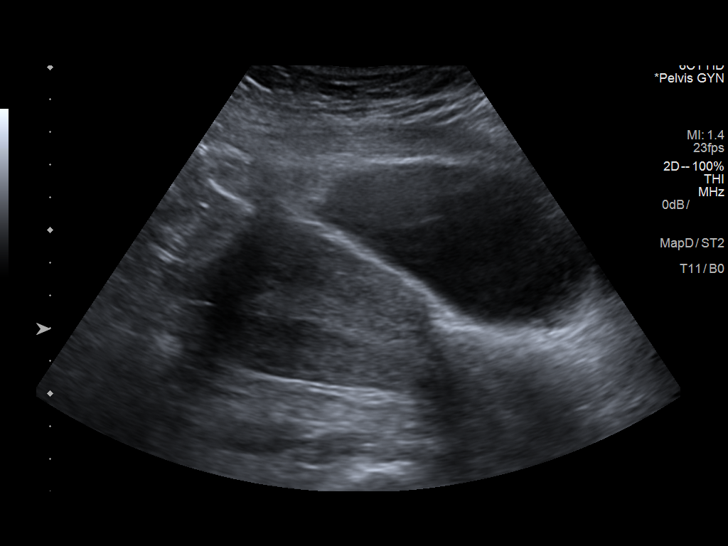
[im 23/90]
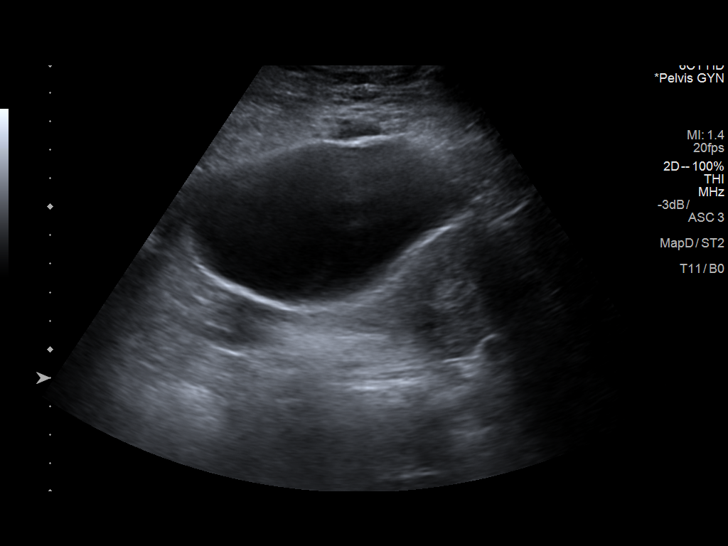
[im 30/90]
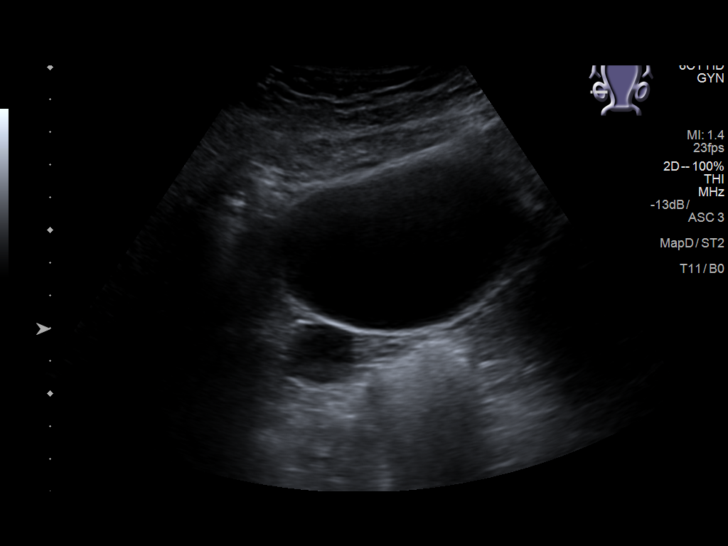
[im 38/90]
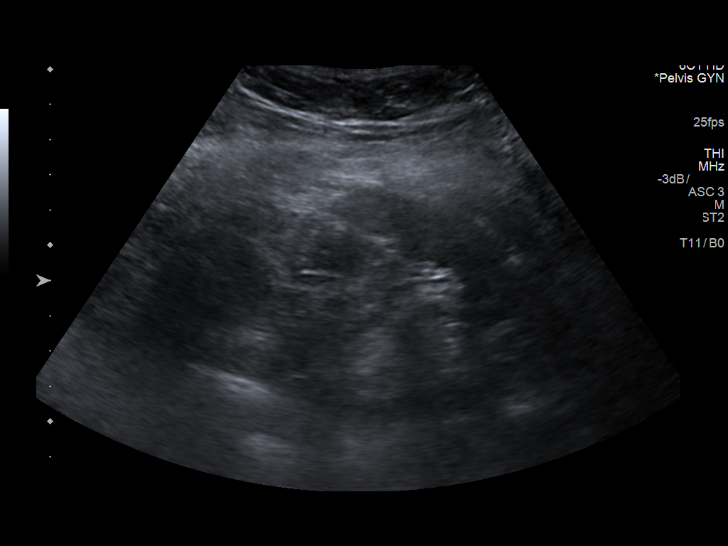
[im 45/90]
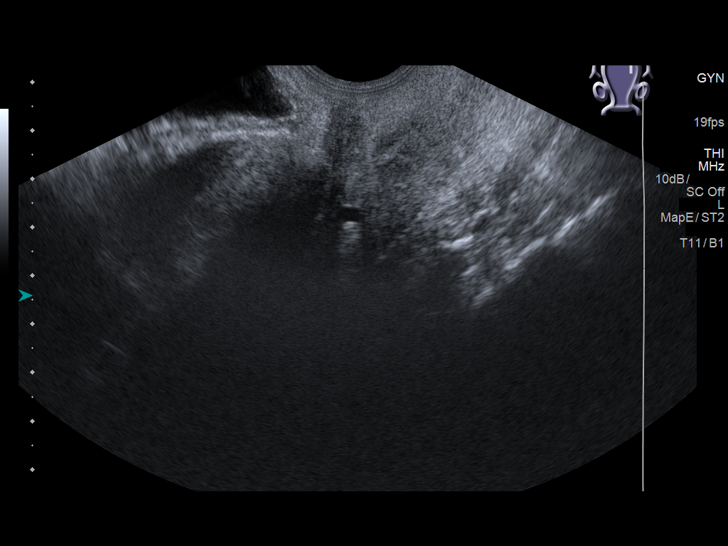
[im 52/90]
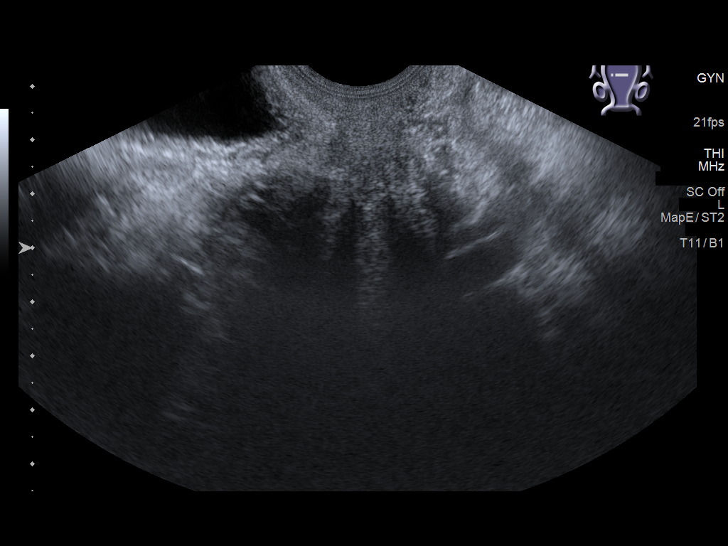
[im 60/90]
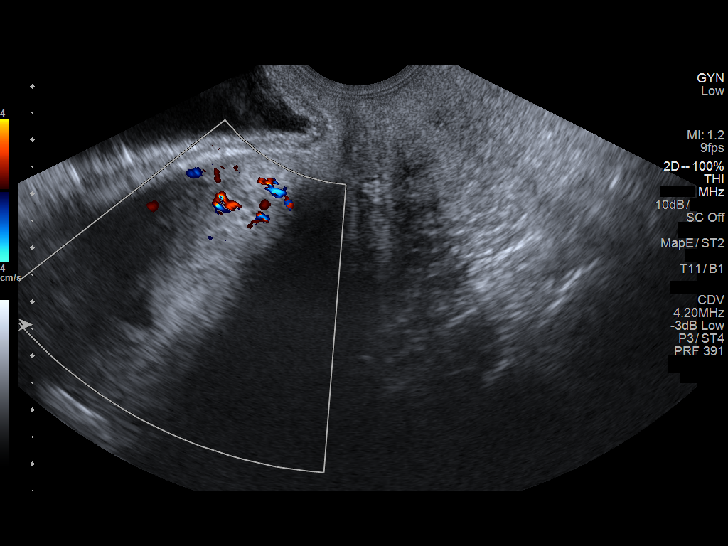
[im 67/90]
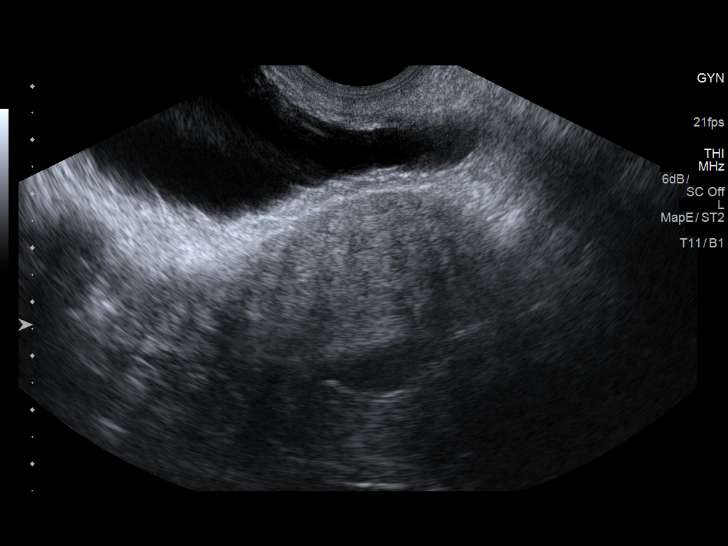
[im 75/90]
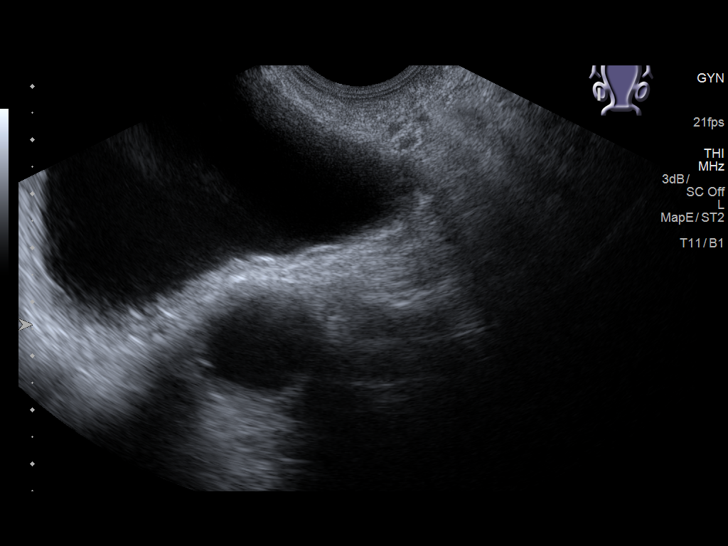
[im 82/90]
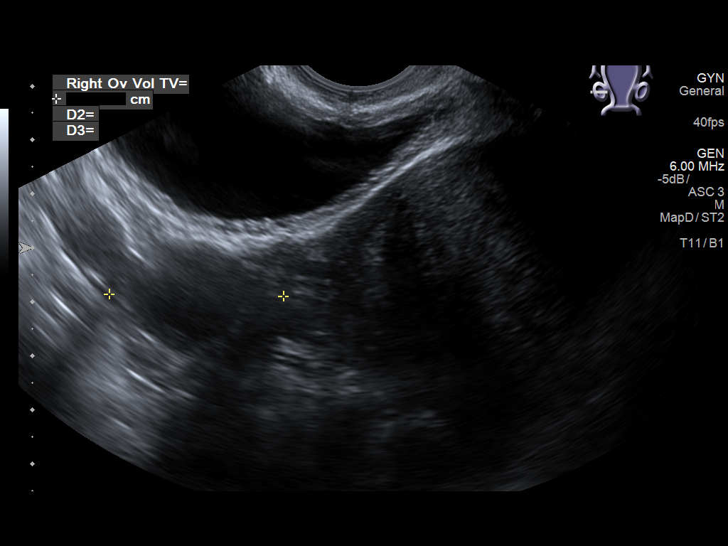
[im 90/90]
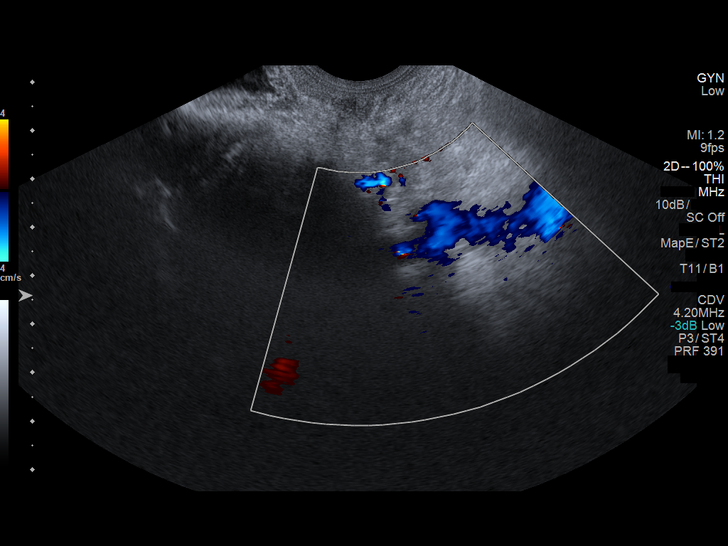

[13 of 25 positions shown; findings below may reference images not displayed]

FINDINGS: Uterus

Measurements: 12.0 x 4.0 x 6.0 cm. Normal morphology without mass

Endometrium

Thickness: 6 mm. Hyperechoic nodule within the endometrium at the
upper uterine segment measuring 12 x 11 x 25 mm question polyp/mass.
Small amount of associated endometrial fluid.

Right ovary

Measurements: 2.8 x 2.5 x 3.2 cm. Dominant follicle 19 mm diameter
without additional mass

Left ovary

Not visualized on either transabdominal or endovaginal imaging
question obscured by bowel

Other findings

No free pelvic fluid or adnexal masses otherwise seen.
IMPRESSION: Nonvisualization of LEFT ovary.

Endometrial mass 12 x 11 x 25 mm question polyp versus tumor with
small amount of associated endometrial fluid, recommendation below.

Consider further evaluation with sonohysterogram for confirmation
prior to hysteroscopy. Endometrial sampling should also be
considered if patient is at high risk for endometrial carcinoma.
(Ref: Radiological Reasoning: Algorithmic Workup of Abnormal Vaginal
Bleeding with Endovaginal Sonography and Sonohysterography. AJR
4007; 191:S68-73)

## 2017-12-27 ENCOUNTER — Encounter: Payer: Self-pay | Admitting: Radiology

## 2019-01-30 ENCOUNTER — Other Ambulatory Visit: Payer: Self-pay | Admitting: Family Medicine

## 2019-01-30 DIAGNOSIS — Z1231 Encounter for screening mammogram for malignant neoplasm of breast: Secondary | ICD-10-CM

## 2020-08-01 ENCOUNTER — Other Ambulatory Visit: Payer: Self-pay | Admitting: Internal Medicine

## 2020-08-01 DIAGNOSIS — Z1231 Encounter for screening mammogram for malignant neoplasm of breast: Secondary | ICD-10-CM

## 2022-02-24 ENCOUNTER — Telehealth: Payer: Self-pay

## 2022-02-24 ENCOUNTER — Other Ambulatory Visit: Payer: Self-pay

## 2022-02-24 DIAGNOSIS — Z1211 Encounter for screening for malignant neoplasm of colon: Secondary | ICD-10-CM

## 2022-02-24 MED ORDER — NA SULFATE-K SULFATE-MG SULF 17.5-3.13-1.6 GM/177ML PO SOLN: 1.0000 | Freq: Once | ORAL | 0 refills | Status: AC

## 2022-02-24 NOTE — Telephone Encounter (Signed)
Gastroenterology Pre-Procedure Review  Request Date: 04/23/22 Requesting Physician: Dr. Tobi Bastos  PATIENT REVIEW QUESTIONS: The patient responded to the following health history questions as indicated:    1. Are you having any GI issues? no 2. Do you have a personal history of Polyps? no 3. Do you have a family history of Colon Cancer or Polyps? no 4. Diabetes Mellitus? no 5. Joint replacements in the past 12 months?no 6. Major health problems in the past 3 months?no 7. Any artificial heart valves, MVP, or defibrillator?no    MEDICATIONS & ALLERGIES:    Patient reports the following regarding taking any anticoagulation/antiplatelet therapy:   Plavix, Coumadin, Eliquis, Xarelto, Lovenox, Pradaxa, Brilinta, or Effient? no Aspirin? no  Patient confirms/reports the following medications:  Current Outpatient Medications  Medication Sig Dispense Refill   amLODipine (NORVASC) 5 MG tablet Take 5 mg by mouth daily.  4   Ascorbic Acid (VITAMIN C PO) Take 4 each by mouth daily. chewable     docusate sodium (COLACE) 100 MG capsule Take 1 capsule (100 mg total) by mouth 2 (two) times daily as needed. 30 capsule 2   esomeprazole (NEXIUM) 40 MG capsule Take 40 mg by mouth daily as needed (acid reflux).      Fe Fum-FA-B Cmp-C-Zn-Mg-Mn-Cu (HEMOCYTE PLUS PO) Take 1 tablet by mouth at bedtime.     ibuprofen (ADVIL,MOTRIN) 600 MG tablet Take 1 tablet (600 mg total) by mouth every 6 (six) hours as needed. 30 tablet 3   megestrol (MEGACE) 40 MG tablet Take 1 tablet (40 mg total) by mouth daily. Can increase to two tablets a day for control of bleeding (Patient not taking: Reported on 05/06/2017) 60 tablet 5   Omega-3 Krill Oil 450 MG CAPS Take 450 mg by mouth daily.      oxyCODONE-acetaminophen (PERCOCET/ROXICET) 5-325 MG tablet Take 1-2 tablets by mouth every 6 (six) hours as needed. (Patient not taking: Reported on 05/06/2017) 20 tablet 0   No current facility-administered medications for this visit.     Patient confirms/reports the following allergies:  No Known Allergies  No orders of the defined types were placed in this encounter.   AUTHORIZATION INFORMATION Primary Insurance: 1D#: Group #:  Secondary Insurance: 1D#: Group #:  SCHEDULE INFORMATION: Date: 04/23/22 Time: Location: [facility]

## 2022-04-23 ENCOUNTER — Ambulatory Visit: Payer: BC Managed Care – PPO | Admitting: Registered Nurse

## 2022-04-23 ENCOUNTER — Ambulatory Visit
Admission: RE | Admit: 2022-04-23 | Discharge: 2022-04-23 | Disposition: A | Discharge status: Home / Self Care | Payer: BC Managed Care – PPO | Attending: Gastroenterology | Admitting: Gastroenterology

## 2022-04-23 ENCOUNTER — Other Ambulatory Visit: Payer: Self-pay

## 2022-04-23 ENCOUNTER — Encounter
Admission: RE | Disposition: A | Discharge status: Home / Self Care | Payer: Self-pay | Source: Home / Self Care | Attending: Gastroenterology

## 2022-04-23 ENCOUNTER — Encounter: Payer: Self-pay | Admitting: Gastroenterology

## 2022-04-23 DIAGNOSIS — Z9851 Tubal ligation status: Secondary | ICD-10-CM | POA: Insufficient documentation

## 2022-04-23 DIAGNOSIS — R7303 Prediabetes: Secondary | ICD-10-CM | POA: Diagnosis not present

## 2022-04-23 DIAGNOSIS — F17200 Nicotine dependence, unspecified, uncomplicated: Secondary | ICD-10-CM | POA: Insufficient documentation

## 2022-04-23 DIAGNOSIS — K219 Gastro-esophageal reflux disease without esophagitis: Secondary | ICD-10-CM | POA: Diagnosis not present

## 2022-04-23 DIAGNOSIS — Z1211 Encounter for screening for malignant neoplasm of colon: Secondary | ICD-10-CM | POA: Insufficient documentation

## 2022-04-23 DIAGNOSIS — J449 Chronic obstructive pulmonary disease, unspecified: Secondary | ICD-10-CM | POA: Diagnosis not present

## 2022-04-23 DIAGNOSIS — I10 Essential (primary) hypertension: Secondary | ICD-10-CM | POA: Insufficient documentation

## 2022-04-23 HISTORY — PX: COLONOSCOPY WITH PROPOFOL: SHX5780

## 2022-04-23 SURGERY — COLONOSCOPY WITH PROPOFOL
Anesthesia: General

## 2022-04-23 MED ORDER — SODIUM CHLORIDE 0.9 % IV SOLN: INTRAVENOUS | Status: DC

## 2022-04-23 MED ORDER — PROPOFOL 10 MG/ML IV BOLUS
INTRAVENOUS | Status: DC | PRN
  Administered 2022-04-23: 70 mg via INTRAVENOUS

## 2022-04-23 MED ORDER — PROPOFOL 500 MG/50ML IV EMUL
INTRAVENOUS | Status: DC | PRN
  Administered 2022-04-23: 140 ug/kg/min via INTRAVENOUS

## 2022-04-23 NOTE — Anesthesia Preprocedure Evaluation (Signed)
Anesthesia Evaluation  Patient identified by MRN, date of birth, ID band Patient awake    Reviewed: Allergy & Precautions, NPO status , Patient's Chart, lab work & pertinent test results  History of Anesthesia Complications (+) PROLONGED EMERGENCE and history of anesthetic complications  Airway Mallampati: III  TM Distance: >3 FB Neck ROM: full    Dental  (+) Chipped, Poor Dentition, Missing, Partial Upper   Pulmonary neg shortness of breath, COPD, Current Smoker and Patient abstained from smoking.,    Pulmonary exam normal        Cardiovascular hypertension, (-) anginaNormal cardiovascular exam     Neuro/Psych negative neurological ROS  negative psych ROS   GI/Hepatic Neg liver ROS, GERD  Controlled,  Endo/Other  negative endocrine ROS  Renal/GU negative Renal ROS  negative genitourinary   Musculoskeletal   Abdominal   Peds  Hematology negative hematology ROS (+)   Anesthesia Other Findings Past Medical History: No date: Anemia No date: Arthritis     Comment:  shoulders No date: Complication of anesthesia     Comment:  slow to wake up after anesthesia No date: GERD (gastroesophageal reflux disease) No date: Hypertension No date: Pre-diabetes  Past Surgical History: No date: CESAREAN SECTION     Comment:  x3 2009: ENDOMETRIAL BIOPSY     Comment:  anemia 04/15/2017: HYSTEROSCOPY WITH D & C; N/A     Comment:  Procedure: DILATATION AND CURETTAGE /HYSTEROSCOPY AND               ENDOMETRIAL POLYPECTOMY;  Surgeon: Tereso Newcomer, MD;              Location: [facility];  Service: Gynecology;  Laterality: N/A; No date: SHOULDER SURGERY     Comment:  left shoulder No date: TUBAL LIGATION     Comment:  x 46yrs     Reproductive/Obstetrics negative OB ROS                             Anesthesia Physical Anesthesia Plan  ASA: 3  Anesthesia Plan: General   Post-op Pain Management:     Induction: Intravenous  PONV Risk Score and Plan: Propofol infusion and TIVA  Airway Management Planned: Natural Airway and Nasal Cannula  Additional Equipment:   Intra-op Plan:   Post-operative Plan:   Informed Consent: I have reviewed the patients History and Physical, chart, labs and discussed the procedure including the risks, benefits and alternatives for the proposed anesthesia with the patient or authorized representative who has indicated his/her understanding and acceptance.     Dental Advisory Given  Plan Discussed with: Anesthesiologist, CRNA and Surgeon  Anesthesia Plan Comments: (Patient consented for risks of anesthesia including but not limited to:  - adverse reactions to medications - risk of airway placement if required - damage to eyes, teeth, lips or other oral mucosa - nerve damage due to positioning  - sore throat or hoarseness - Damage to heart, brain, nerves, lungs, other parts of body or loss of life  Patient voiced understanding.)        Anesthesia Quick Evaluation

## 2022-04-23 NOTE — H&P (Signed)
Wyline Mood, MD [address], [address], [place], [place], [address] [address], [address], [place], [place], [address] Phone: [phone]  Fax: [phone]  Primary Care Physician:  [facility]   Pre-Procedure History & Physical: HPI:  Jancie Kercher is a 56 y.o. female is here for an colonoscopy.   Past Medical History:  Diagnosis Date   Anemia    Arthritis    shoulders   Complication of anesthesia    slow to wake up after anesthesia   GERD (gastroesophageal reflux disease)    Hypertension    Pre-diabetes     Past Surgical History:  Procedure Laterality Date   CESAREAN SECTION     x3   ENDOMETRIAL BIOPSY  2009   anemia   HYSTEROSCOPY WITH D & C N/A 04/15/2017   Procedure: DILATATION AND CURETTAGE /HYSTEROSCOPY AND ENDOMETRIAL POLYPECTOMY;  Surgeon: Tereso Newcomer, MD;  Location: [facility];  Service: Gynecology;  Laterality: N/A;   SHOULDER SURGERY     left shoulder   TUBAL LIGATION     x 62yrs    Prior to Admission medications   Medication Sig Start Date End Date Taking? Authorizing Provider  amLODipine (NORVASC) 5 MG tablet Take 5 mg by mouth daily. 02/15/17  Yes [provider]  Ascorbic Acid (VITAMIN C PO) Take 4 each by mouth daily. chewable   Yes [provider]  docusate sodium (COLACE) 100 MG capsule Take 1 capsule (100 mg total) by mouth 2 (two) times daily as needed. 04/15/17  Yes Anyanwu, Jethro Bastos, MD  esomeprazole (NEXIUM) 40 MG capsule Take 40 mg by mouth daily as needed (acid reflux).    Yes [provider]  Fe Fum-FA-B Cmp-C-Zn-Mg-Mn-Cu (HEMOCYTE PLUS PO) Take 1 tablet by mouth at bedtime.   Yes [provider]  ibuprofen (ADVIL,MOTRIN) 600 MG tablet Take 1 tablet (600 mg total) by mouth every 6 (six) hours as needed. 04/15/17  Yes Anyanwu, Jethro Bastos, MD  megestrol (MEGACE) 40 MG tablet Take 1 tablet (40 mg total) by mouth daily. Can increase to two tablets a day for control of bleeding 03/02/17   Yes Anyanwu, Jethro Bastos, MD  Omega-3 Krill Oil 450 MG CAPS Take 450 mg by mouth daily.    Yes [provider]  oxyCODONE-acetaminophen (PERCOCET/ROXICET) 5-325 MG tablet Take 1-2 tablets by mouth every 6 (six) hours as needed. 04/15/17   Tereso Newcomer, MD    Allergies as of 02/24/2022   (No Known Allergies)    Family History  Problem Relation Age of Onset   Hypertension Mother    Diabetes Mother    Hypertension Father     Social History   Socioeconomic History   Marital status: Married    Spouse name: Not on file   Number of children: Not on file   Years of education: Not on file   Highest education level: Not on file  Occupational History   Not on file  Tobacco Use   Smoking status: Some Days    Packs/day: 0.00    Types: Cigarettes   Smokeless tobacco: Never   Tobacco comments:    socially  Vaping Use   Vaping Use: Never used  Substance and Sexual Activity   Alcohol use: Yes    Comment: once or twice yearly   Drug use: No   Sexual activity: Yes    Partners: Male    Birth control/protection: Surgical    Comment: tubalization.  Other Topics Concern  Not on file  Social History Narrative   Not on file   Social Determinants of Health   Financial Resource Strain: Not on file  Food Insecurity: Not on file  Transportation Needs: Not on file  Physical Activity: Not on file  Stress: Not on file  Social Connections: Not on file  Intimate Partner Violence: Not on file    Review of Systems: See HPI, otherwise negative ROS  Physical Exam: BP 134/86   Pulse 71   Temp (!) 96.6 F (35.9 C) (Temporal)   Resp 16   LMP 03/21/2017   SpO2 97%  General:   Alert,  pleasant and cooperative in NAD Head:  Normocephalic and atraumatic. Neck:  Supple; no masses or thyromegaly. Lungs:  Clear throughout to auscultation, normal respiratory effort.    Heart:  +S1, +S2, Regular rate and rhythm, No edema. Abdomen:  Soft, nontender and nondistended. Normal bowel  sounds, without guarding, and without rebound.   Neurologic:  Alert and  oriented x4;  grossly normal neurologically.  Impression/Plan: Wrenly Lauritsen is here for an colonoscopy to be performed for Screening colonoscopy average risk   Risks, benefits, limitations, and alternatives regarding  colonoscopy have been reviewed with the patient.  Questions have been answered.  All parties agreeable.   Wyline Mood, MD  04/23/2022, 7:44 AM

## 2022-04-23 NOTE — Op Note (Signed)
[facility] Patient Name: Megan Pollard Procedure Date: 04/23/2022 7:17 AM MRN: [id number] Account #: [id number] Date of Birth: 01/23/1966 Admit Type: Outpatient Age: 56 Room: [facility] ENDO ROOM 4 Gender: Female Note Status: Finalized Instrument Name: Prentice Docker [id number] Procedure:             Colonoscopy Indications:           Screening for colorectal malignant neoplasm Providers:             Wyline Mood MD, MD Referring MD:          [facility] Medicines:             Monitored Anesthesia Care Complications:         No immediate complications. Procedure:             Pre-Anesthesia Assessment:                        - Prior to the procedure, a History and Physical was                         performed, and patient medications, allergies and                         sensitivities were reviewed. The patient's tolerance                         of previous anesthesia was reviewed.                        - The risks and benefits of the procedure and the                         sedation options and risks were discussed with the                         patient. All questions were answered and informed                         consent was obtained.                        - ASA Grade Assessment: II - A patient with mild                         systemic disease.                        After obtaining informed consent, the colonoscope was                         passed under direct vision. Throughout the procedure,                         the patient's blood pressure, pulse, and oxygen                         saturations were monitored continuously. The                         Colonoscope was introduced through  the anus and                         advanced to the the cecum, identified by the                         appendiceal orifice. Findings:      The perianal and digital rectal examinations were normal.      The entire examined colon  appeared normal on direct and retroflexion       views. Impression:            - The entire examined colon is normal on direct and                         retroflexion views.                        - No specimens collected. Recommendation:        - Discharge patient to home (with escort).                        - Resume previous diet.                        - Continue present medications.                        - Repeat colonoscopy in 10 years for screening                         purposes. Procedure Code(s):     --- Professional ---                        [id number], Colonoscopy, flexible; diagnostic, including                         collection of specimen(s) by brushing or washing, when                         performed (separate procedure) Diagnosis Code(s):     --- Professional ---                        Z12.11, Encounter for screening for malignant neoplasm                         of colon CPT copyright 2019 [employer]. All rights reserved. The codes documented in this report are preliminary and upon coder review may  be revised to meet current compliance requirements. Wyline Mood, MD Wyline Mood MD, MD 04/23/2022 8:05:10 AM This report has been signed electronically. Number of Addenda: 0 Note Initiated On: 04/23/2022 7:17 AM Scope Withdrawal Time: 0 hours 6 minutes 55 seconds  Total Procedure Duration: 0 hours 10 minutes 12 seconds  Estimated Blood Loss:  Estimated blood loss: none.      [facility]

## 2022-04-23 NOTE — Transfer of Care (Signed)
Immediate Anesthesia Transfer of Care Note  Patient: Megan Pollard  Procedure(s) Performed: COLONOSCOPY WITH PROPOFOL  Patient Location: PACU  Anesthesia Type:General  Level of Consciousness: awake, alert  and oriented  Airway & Oxygen Therapy: Patient Spontanous Breathing  Post-op Assessment: Report given to RN and Post -op Vital signs reviewed and stable  Post vital signs: Reviewed and stable  Last Vitals:  Vitals Value Taken Time  BP    Temp    Pulse    Resp    SpO2      Last Pain:  Vitals:   04/23/22 0724  TempSrc: Temporal  PainSc: 0-No pain         Complications: No notable events documented.

## 2022-04-23 NOTE — Anesthesia Postprocedure Evaluation (Signed)
Anesthesia Post Note  Patient: Immunologist  Procedure(s) Performed: COLONOSCOPY WITH PROPOFOL  Patient location during evaluation: Endoscopy Anesthesia Type: General Level of consciousness: awake and alert Pain management: pain level controlled Vital Signs Assessment: post-procedure vital signs reviewed and stable Respiratory status: spontaneous breathing, nonlabored ventilation, respiratory function stable and patient connected to nasal cannula oxygen Cardiovascular status: blood pressure returned to baseline and stable Postop Assessment: no apparent nausea or vomiting Anesthetic complications: no   No notable events documented.   Last Vitals:  Vitals:   04/23/22 0805 04/23/22 0825  BP: 117/75 134/86  Pulse: 73 66  Resp: 13   Temp: (!) 36.1 C   SpO2: 97% 100%    Last Pain:  Vitals:   04/23/22 0825  TempSrc:   PainSc: 0-No pain                 Cleda Mccreedy Raeya Merritts

## 2024-01-26 ENCOUNTER — Other Ambulatory Visit: Payer: Self-pay | Admitting: Nurse Practitioner

## 2024-01-26 DIAGNOSIS — Z1231 Encounter for screening mammogram for malignant neoplasm of breast: Secondary | ICD-10-CM
